# Patient Record
Sex: Female | Born: 1950 | Race: White | Hispanic: No | Smoking: Never smoker
Health system: Southern US, Community
[De-identification: ages and names within clinical notes are randomized; demographics above are authoritative.]

## PROBLEM LIST (undated history)

## (undated) DIAGNOSIS — Z789 Other specified health status: Secondary | ICD-10-CM

## (undated) DIAGNOSIS — I719 Aortic aneurysm of unspecified site, without rupture: Secondary | ICD-10-CM

## (undated) DIAGNOSIS — I10 Essential (primary) hypertension: Secondary | ICD-10-CM

## (undated) DIAGNOSIS — I739 Peripheral vascular disease, unspecified: Secondary | ICD-10-CM

## (undated) HISTORY — DX: Peripheral vascular disease, unspecified: I73.9

## (undated) HISTORY — PX: TUBAL LIGATION: SHX77

## (undated) HISTORY — PX: VARICOSE VEIN SURGERY: SHX832

## (undated) HISTORY — DX: Essential (primary) hypertension: I10

---

## 1998-07-29 ENCOUNTER — Emergency Department (HOSPITAL_COMMUNITY): Admission: EM | Admit: 1998-07-29 | Discharge: 1998-07-29 | Payer: Self-pay | Admitting: Emergency Medicine

## 1999-02-27 ENCOUNTER — Other Ambulatory Visit: Admission: RE | Admit: 1999-02-27 | Discharge: 1999-02-27 | Payer: Self-pay | Admitting: *Deleted

## 1999-02-27 ENCOUNTER — Encounter (INDEPENDENT_AMBULATORY_CARE_PROVIDER_SITE_OTHER): Payer: Self-pay

## 2000-10-12 ENCOUNTER — Encounter: Payer: Self-pay | Admitting: Cardiovascular Disease

## 2000-11-01 ENCOUNTER — Other Ambulatory Visit: Admission: RE | Admit: 2000-11-01 | Discharge: 2000-11-01 | Payer: Self-pay | Admitting: *Deleted

## 2001-06-30 ENCOUNTER — Ambulatory Visit (HOSPITAL_COMMUNITY): Admission: RE | Admit: 2001-06-30 | Discharge: 2001-06-30 | Payer: Self-pay | Admitting: Gastroenterology

## 2004-10-01 ENCOUNTER — Ambulatory Visit: Payer: Self-pay | Admitting: Family Medicine

## 2006-01-07 ENCOUNTER — Ambulatory Visit: Payer: Self-pay | Admitting: Family Medicine

## 2006-02-11 ENCOUNTER — Ambulatory Visit: Payer: Self-pay | Admitting: Family Medicine

## 2009-03-10 ENCOUNTER — Ambulatory Visit (HOSPITAL_COMMUNITY): Admission: RE | Admit: 2009-03-10 | Discharge: 2009-03-10 | Payer: Self-pay | Admitting: Internal Medicine

## 2010-04-29 ENCOUNTER — Ambulatory Visit (HOSPITAL_COMMUNITY): Admission: RE | Admit: 2010-04-29 | Discharge: 2010-04-29 | Payer: Self-pay | Admitting: Internal Medicine

## 2010-08-31 ENCOUNTER — Encounter: Payer: Self-pay | Admitting: Unknown Physician Specialty

## 2010-12-25 HISTORY — PX: OTHER SURGICAL HISTORY: SHX169

## 2010-12-25 NOTE — Procedures (Signed)
[facility]  Patient:    Amanda Vang, Amanda Vang Visit Number: [id number] MRN: [id number]          Service Type: END Location: ENDO Attending Physician:  Louie Bun Dictated by:   Everardo All Madilyn Fireman, M.D. Admit Date:  06/30/2001   CC:         Colon Flattery, D.O.   Procedure Report  PROCEDURE:  Colonoscopy.  ENDOSCOPIST:  Everardo All. Madilyn Fireman, M.D.  INDICATIONS:  Screening colonoscopy.  DESCRIPTION OF PROCEDURE:  The patient was placed in the left lateral decubitus position and placed on the pulse monitor with continuous low-flow oxygen delivered via nasal cannula.  She was sedated with 50 mg of IV Demerol and 5 mg IV Versed.  The Olympus videoscopic colonoscope was inserted into the rectum and advanced to the cecum, confirmed by transillumination of McBurneys point and visualization of the ileocecal vale and appendiceal orifice.  The prep was excellent.  The cecum, ascending, transverse, descending, and sigmoid colon all appeared normal with no masses, polyps, diverticula, or other mucosal abnormalities.  The rectum likewise appeared normal on retroflex view. The anus revealed no obvious internal hemorrhoids.  The  colonoscope was then withdrawn, and the patient returned to the recovery room in stable condition. She tolerated the procedure well and there were no immediate complications.  IMPRESSION:  Essentially normal colonoscopy.  PLAN:  Flexible sigmoidoscopy in five years.  Consider repeat colonoscopy in 10 years. Dictated by:   Everardo All Madilyn Fireman, M.D. Attending Physician:  Louie Bun DD:  06/30/01 TD:  07/01/01 Job: [id number]

## 2011-03-29 ENCOUNTER — Encounter (HOSPITAL_COMMUNITY): Payer: Self-pay

## 2011-03-29 ENCOUNTER — Encounter (HOSPITAL_COMMUNITY)
Admission: RE | Admit: 2011-03-29 | Discharge: 2011-03-29 | Disposition: A | Discharge status: Home / Self Care | Payer: BC Managed Care – PPO | Source: Ambulatory Visit | Attending: Ophthalmology | Admitting: Ophthalmology

## 2011-03-29 ENCOUNTER — Other Ambulatory Visit: Payer: Self-pay

## 2011-03-29 HISTORY — DX: Other specified health status: Z78.9

## 2011-03-29 LAB — BASIC METABOLIC PANEL
CO2: 28 mEq/L (ref 19–32)
Calcium: 9.4 mg/dL (ref 8.4–10.5)
Chloride: 104 mEq/L (ref 96–112)
Glucose, Bld: 107 mg/dL — ABNORMAL HIGH (ref 70–99)
Sodium: 140 mEq/L (ref 135–145)

## 2011-03-29 LAB — CBC
Hemoglobin: 13.7 g/dL (ref 12.0–15.0)
MCH: 28.5 pg (ref 26.0–34.0)
MCV: 85.9 fL (ref 78.0–100.0)
Platelets: 213 10*3/uL (ref 150–400)
RBC: 4.81 MIL/uL (ref 3.87–5.11)
WBC: 5.5 10*3/uL (ref 4.0–10.5)

## 2011-03-29 NOTE — Patient Instructions (Addendum)
20 Amanda Vang  03/29/2011   Your procedure is scheduled on:  Monday, 04/05/11  Report to [facility] at 08:25 AM.  Call this number if you have problems the morning of surgery: [phone]   Remember:   Do not eat food:After Midnight.  Do not drink clear liquids: After Midnight.  Take these medicines the morning of surgery with A SIP OF WATER: none   Do not wear jewelry, make-up or nail polish.  Do not wear lotions, powders, or perfumes. You may wear deodorant.  Do not shave 48 hours prior to surgery.  Do not bring valuables to the hospital.  Contacts, dentures or bridgework may not be worn into surgery.  Leave suitcase in the car. After surgery it may be brought to your room.  For patients admitted to the hospital, checkout time is 11:00 AM the day of discharge.   Patients discharged the day of surgery will not be allowed to drive home.  Name and phone number of your driver: family  Special Instructions: Use eye drops as prescribed.   Please read over the following fact sheets that you were given: Anesthesia Post-op Instructions   Sedation, Moderate, Adult Moderate sedation is given to help you relax or even sleep through a procedure. You may remain sleepy, be clumsy, or have poor balance for several hours following this procedure. Arrange for a responsible adult, family member, or friend to take you home. A responsible adult should stay with you for at least 24 hours or until the medicines have worn off.  Do not participate in any activities where you could become injured for the next 24 hours, or until you feel normal again. Do not:   Drive.  Swim.   Ride a bicycle.   Operate heavy machinery.   Cook.  Use power tools.   Climb ladders.   Work at [employer].    Do not make important decisions or sign legal documents until you are improved.   Vomiting may occur if you eat too soon. When you can drink without vomiting, try water, juice, or soup. Try solid foods if you feel  little or no nausea.   Only take over-the-counter or prescription medications for pain, discomfort, or fever as directed by your caregiver. If pain medications have been prescribed for you, ask your caregiver how soon it is safe to take them.   Make sure you and your family fully understands everything about the medication given to you. Make sure you understand what side effects may occur.   You should not drink alcohol, take sleeping pills, or medications that cause drowsiness for at least 24 hours.   If you smoke, do not smoke alone.   If you are feeling better, you may resume normal activities 24 hours after receiving sedation.   Keep all appointments as scheduled. Follow all instructions.   Ask questions if you do not understand.  SEEK MEDICAL CARE IF:  Your skin is pale or bluish in color.   You continue to feel sick to your stomach (nauseated) or throw up (vomit).   Your pain is getting worse and not helped by medication.   There is bleeding or swelling.   You are still sleepy or feeling clumsy after 24 hours.   You have an oral temperature above 102 F (38.9 C).  SEEK IMMEDIATE MEDICAL CARE IF:   You develop a rash.   You have difficulty breathing.   You develop any type of allergic problem.   You have  an oral temperature above 102 F (38.9 C), not controlled by medicine.  Document Released: 04/20/2001 Document Re-Released: 01/13/2010 [facility] Patient Information 2011 [place], [place].

## 2011-04-05 ENCOUNTER — Ambulatory Visit (HOSPITAL_COMMUNITY): Payer: BC Managed Care – PPO | Admitting: Anesthesiology

## 2011-04-05 ENCOUNTER — Encounter (HOSPITAL_COMMUNITY)
Admission: RE | Disposition: A | Discharge status: Home / Self Care | Payer: Self-pay | Source: Ambulatory Visit | Attending: Ophthalmology

## 2011-04-05 ENCOUNTER — Encounter (HOSPITAL_COMMUNITY): Payer: Self-pay | Admitting: Anesthesiology

## 2011-04-05 ENCOUNTER — Ambulatory Visit (HOSPITAL_COMMUNITY)
Admission: RE | Admit: 2011-04-05 | Discharge: 2011-04-05 | Disposition: A | Discharge status: Home / Self Care | Payer: BC Managed Care – PPO | Source: Ambulatory Visit | Attending: Ophthalmology | Admitting: Ophthalmology

## 2011-04-05 ENCOUNTER — Encounter (HOSPITAL_COMMUNITY): Payer: Self-pay | Admitting: *Deleted

## 2011-04-05 DIAGNOSIS — Z01812 Encounter for preprocedural laboratory examination: Secondary | ICD-10-CM | POA: Insufficient documentation

## 2011-04-05 DIAGNOSIS — H268 Other specified cataract: Secondary | ICD-10-CM | POA: Insufficient documentation

## 2011-04-05 DIAGNOSIS — H2510 Age-related nuclear cataract, unspecified eye: Secondary | ICD-10-CM | POA: Insufficient documentation

## 2011-04-05 HISTORY — PX: CATARACT EXTRACTION W/PHACO: SHX586

## 2011-04-05 SURGERY — PHACOEMULSIFICATION, CATARACT, WITH IOL INSERTION
Anesthesia: Monitor Anesthesia Care | Site: Eye | Laterality: Left | Wound class: Clean

## 2011-04-05 MED ORDER — NEOMYCIN-POLYMYXIN-DEXAMETH 0.1 % OP OINT
TOPICAL_OINTMENT | OPHTHALMIC | Status: DC | PRN
  Administered 2011-04-05: 1 via OPHTHALMIC

## 2011-04-05 MED ORDER — PHENYLEPHRINE HCL 2.5 % OP SOLN
1.0000 [drp] | OPHTHALMIC | Status: AC
  Administered 2011-04-05 (×3): 1 [drp] via OPHTHALMIC

## 2011-04-05 MED ORDER — TETRACAINE HCL 0.5 % OP SOLN
1.0000 [drp] | OPHTHALMIC | Status: AC
  Administered 2011-04-05 (×3): 1 [drp] via OPHTHALMIC

## 2011-04-05 MED ORDER — MIDAZOLAM HCL 2 MG/2ML IJ SOLN
INTRAMUSCULAR | Status: AC
  Administered 2011-04-05: 2 mg via INTRAVENOUS
  Filled 2011-04-05: qty 2

## 2011-04-05 MED ORDER — LACTATED RINGERS IV SOLN
INTRAVENOUS | Status: DC | PRN
  Administered 2011-04-05: 09:00:00 via INTRAVENOUS

## 2011-04-05 MED ORDER — EPINEPHRINE HCL 1 MG/ML IJ SOLN
INTRAOCULAR | Status: DC | PRN
  Administered 2011-04-05: 10:00:00

## 2011-04-05 MED ORDER — LIDOCAINE HCL 3.5 % OP GEL
OPHTHALMIC | Status: AC
  Administered 2011-04-05: 1 via OPHTHALMIC
  Filled 2011-04-05: qty 5

## 2011-04-05 MED ORDER — NEOMYCIN-POLYMYXIN-DEXAMETH 3.5-10000-0.1 OP OINT
TOPICAL_OINTMENT | OPHTHALMIC | Status: AC
  Filled 2011-04-05: qty 3.5

## 2011-04-05 MED ORDER — MIDAZOLAM HCL 2 MG/2ML IJ SOLN
1.0000 mg | INTRAMUSCULAR | Status: DC | PRN
  Administered 2011-04-05: 2 mg via INTRAVENOUS

## 2011-04-05 MED ORDER — CYCLOPENTOLATE-PHENYLEPHRINE 0.2-1 % OP SOLN
1.0000 [drp] | OPHTHALMIC | Status: AC
Start: 2011-04-05 — End: 2011-04-05
  Administered 2011-04-05 (×3): 1 [drp] via OPHTHALMIC

## 2011-04-05 MED ORDER — PHENYLEPHRINE HCL 2.5 % OP SOLN
OPHTHALMIC | Status: AC
  Administered 2011-04-05: 1 [drp] via OPHTHALMIC
  Filled 2011-04-05: qty 2

## 2011-04-05 MED ORDER — LACTATED RINGERS IV SOLN: INTRAVENOUS | Status: DC

## 2011-04-05 MED ORDER — PROVISC 10 MG/ML IO SOLN
INTRAOCULAR | Status: DC | PRN
  Administered 2011-04-05: 8.5 mg via OPHTHALMIC

## 2011-04-05 MED ORDER — POVIDONE-IODINE 5 % OP SOLN
OPHTHALMIC | Status: DC | PRN
  Administered 2011-04-05: 1 via OPHTHALMIC

## 2011-04-05 MED ORDER — CYCLOPENTOLATE-PHENYLEPHRINE 0.2-1 % OP SOLN
OPHTHALMIC | Status: AC
  Administered 2011-04-05: 1 [drp] via OPHTHALMIC
  Filled 2011-04-05: qty 2

## 2011-04-05 MED ORDER — LIDOCAINE HCL 3.5 % OP GEL
1.0000 "application " | Freq: Once | OPHTHALMIC | Status: AC
  Administered 2011-04-05: 1 via OPHTHALMIC

## 2011-04-05 MED ORDER — TETRACAINE HCL 0.5 % OP SOLN
OPHTHALMIC | Status: AC
  Administered 2011-04-05: 1 [drp] via OPHTHALMIC
  Filled 2011-04-05: qty 2

## 2011-04-05 MED ORDER — KETOROLAC TROMETHAMINE 0.5 % OP SOLN: 1.0000 [drp] | OPHTHALMIC | Status: AC

## 2011-04-05 MED ORDER — LIDOCAINE HCL 3.5 % OP GEL
OPHTHALMIC | Status: DC | PRN
  Administered 2011-04-05: 1 via OPHTHALMIC

## 2011-04-05 MED ORDER — LIDOCAINE HCL (PF) 1 % IJ SOLN
INTRAMUSCULAR | Status: AC
  Filled 2011-04-05: qty 2

## 2011-04-05 MED ORDER — LACTATED RINGERS IV SOLN
INTRAVENOUS | Status: DC
  Administered 2011-04-05: 1000 mL via INTRAVENOUS

## 2011-04-05 MED ORDER — EPINEPHRINE HCL 1 MG/ML IJ SOLN
INTRAMUSCULAR | Status: AC
  Filled 2011-04-05: qty 1

## 2011-04-05 MED ORDER — LIDOCAINE HCL (PF) 1 % IJ SOLN
INTRAOCULAR | Status: DC | PRN
  Administered 2011-04-05: 10:00:00 via OPHTHALMIC

## 2011-04-05 MED ORDER — BSS IO SOLN
INTRAOCULAR | Status: DC | PRN
  Administered 2011-04-05: 15 mL via OPHTHALMIC

## 2011-04-05 SURGICAL SUPPLY — 34 items
CAPSULAR TENSION RING-AMO (OPHTHALMIC RELATED) IMPLANT
CLOTH BEACON ORANGE TIMEOUT ST (SAFETY) ×2 IMPLANT
DUOVISC SYSTEM (INTRAOCULAR LENS)
EYE SHIELD UNIVERSAL CLEAR (GAUZE/BANDAGES/DRESSINGS) ×2 IMPLANT
GLOVE BIO SURGEON STRL SZ 6.5 (GLOVE) IMPLANT
GLOVE BIOGEL PI IND STRL 6.5 (GLOVE) ×1 IMPLANT
GLOVE BIOGEL PI IND STRL 7.0 (GLOVE) IMPLANT
GLOVE BIOGEL PI IND STRL 7.5 (GLOVE) IMPLANT
GLOVE BIOGEL PI INDICATOR 6.5 (GLOVE) ×1
GLOVE BIOGEL PI INDICATOR 7.0 (GLOVE)
GLOVE BIOGEL PI INDICATOR 7.5 (GLOVE)
GLOVE ECLIPSE 6.5 STRL STRAW (GLOVE) IMPLANT
GLOVE ECLIPSE 7.0 STRL STRAW (GLOVE) IMPLANT
GLOVE ECLIPSE 7.5 STRL STRAW (GLOVE) IMPLANT
GLOVE EXAM NITRILE LRG STRL (GLOVE) IMPLANT
GLOVE EXAM NITRILE MD LF STRL (GLOVE) ×2 IMPLANT
GLOVE SKINSENSE NS SZ6.5 (GLOVE)
GLOVE SKINSENSE NS SZ7.0 (GLOVE)
GLOVE SKINSENSE STRL SZ6.5 (GLOVE) IMPLANT
GLOVE SKINSENSE STRL SZ7.0 (GLOVE) IMPLANT
KIT VITRECTOMY (OPHTHALMIC RELATED) IMPLANT
PAD ARMBOARD 7.5X6 YLW CONV (MISCELLANEOUS) ×2 IMPLANT
PROC W NO LENS (INTRAOCULAR LENS)
PROC W SPEC LENS (INTRAOCULAR LENS)
PROCESS W NO LENS (INTRAOCULAR LENS) IMPLANT
PROCESS W SPEC LENS (INTRAOCULAR LENS) IMPLANT
RING MALYGIN (MISCELLANEOUS) IMPLANT
SIGHTPATH CAT PROC W REG LENS (Ophthalmic Related) ×2 IMPLANT
SYR TB 1ML LL NO SAFETY (SYRINGE) ×2 IMPLANT
SYSTEM DUOVISC (INTRAOCULAR LENS) IMPLANT
TAPE SURG TRANSPORE 1 IN (GAUZE/BANDAGES/DRESSINGS) ×1 IMPLANT
TAPE SURGICAL TRANSPORE 1 IN (GAUZE/BANDAGES/DRESSINGS) ×1
VISCOELASTIC ADDITIONAL (OPHTHALMIC RELATED) IMPLANT
WATER STERILE IRR 250ML POUR (IV SOLUTION) ×2 IMPLANT

## 2011-04-05 NOTE — Anesthesia Postprocedure Evaluation (Signed)
Anesthesia Post Note  Patient: Amanda Vang  Procedure(s) Performed:  CATARACT EXTRACTION PHACO AND INTRAOCULAR LENS PLACEMENT (IOC) - CDE 11.84  Anesthesia type: MAC  Patient location: Short Stay  Post pain: Pain level controlled  Post assessment: Post-op Vital signs reviewed, Patient's Cardiovascular Status Stable, Respiratory Function Stable and No signs of Nausea or vomiting  Last Vitals:  Filed Vitals:   04/05/11 1025  BP: 128/83  Pulse: 57  Temp: 97.8 F (36.6 C)  Resp: 18    Post vital signs: Reviewed and stable  Level of consciousness: awake, alert , oriented and patient cooperative  Complications: No apparent anesthesia complications

## 2011-04-05 NOTE — Transfer of Care (Signed)
  Anesthesia Post-op Note  Patient: Amanda Vang  Procedure(s) Performed:  CATARACT EXTRACTION PHACO AND INTRAOCULAR LENS PLACEMENT (IOC) - CDE 11.84  Patient Location: PACU and Short Stay  Anesthesia Type: MAC  Level of Consciousness: awake, alert , oriented and patient cooperative  Airway and Oxygen Therapy: Patient Spontanous Breathing  Post-op Pain: none  Post-op Assessment: Post-op Vital signs reviewed, Patient's Cardiovascular Status Stable, Respiratory Function Stable and No signs of Nausea or vomiting  Post-op Vital Signs: Reviewed and stable  Complications: No apparent anesthesia complications

## 2011-04-05 NOTE — Op Note (Signed)
Amanda, Vang               ACCOUNT NO.:  [id number]  MEDICAL RECORD NO.:  [id number]  LOCATION:  [facility]                          FACILITY:  [facility]  PHYSICIAN:  Susanne Greenhouse, MD       DATE OF BIRTH:  1950/12/30  DATE OF PROCEDURE:  04/05/2011 DATE OF DISCHARGE:  04/05/2011                              OPERATIVE REPORT   PREOPERATIVE DIAGNOSIS:  Nuclear cataract, left eye; diagnosis code 366.16.  POSTOPERATIVE DIAGNOSES: 1. Nuclear cataract, left eye; diagnosis code 366.16. 2. 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  OPERATION PERFORMED:  Phacoemulsification with posterior chamber intraocular lens implantation, left eye.  SURGEON:  Susanne Greenhouse, MD.  ANESTHESIA:  General endotracheal anesthesia.  OPERATIVE SUMMARY:  In the preoperative area, dilating drops were placed into the left eye.  The patient was then brought into the operating room where she was placed under general anesthesia.  The eye was then prepped and draped.  Beginning with a 75 blade, a paracentesis port was made at the surgeon's 2 o'clock position.  The anterior chamber was then filled with a 1% nonpreserved lidocaine solution with epinephrine.  This was followed by Viscoat to deepen the chamber.  A small fornix-based peritomy was performed superiorly.  Next, a single iris hook was placed through the limbus superiorly.  A 2.4-mm keratome blade was then used to make a clear corneal incision over the iris hook.  A bent cystotome needle and Utrata forceps were used to create a continuous tear capsulotomy.  Hydrodissection was performed using balanced salt solution on a fine cannula.  The lens nucleus was then removed using phacoemulsification in a quadrant cracking technique.  The cortical material was then removed with irrigation and aspiration.  The capsular bag and anterior chamber were refilled with Provisc.  The wound was widened to approximately 3 mm and a posterior chamber  intraocular lens was placed into the capsular bag without difficulty using an [employer] lens injecting system.  A single 10-0 nylon suture was then used to close the incision as well as stromal hydration.  The Provisc was removed from the anterior chamber and capsular bag with irrigation and aspiration.  At this point, the wounds were tested for leak, which were negative.  The anterior chamber remained deep and stable.  The patient tolerated the procedure well.  There were no operative complications, and she awoke from general anesthesia without problem.  No surgical specimens.  Prosthetic device used is a Lenstec posterior chamber lens, model Softec HD, power of 19.5, serial number is [id number].          ______________________________ Susanne Greenhouse, MD     KEH/MEDQ  D:  04/05/2011  T:  04/05/2011  Job:  [id number]

## 2011-04-05 NOTE — Brief Op Note (Signed)
04/05/2011  10:24 AM  PATIENT:  Amanda Vang  60 y.o. female  PRE-OPERATIVE DIAGNOSIS:  nuclear cataract left eye  POST-OPERATIVE DIAGNOSIS:  nuclear cataract left eye  PROCEDURE:  Procedure(s): CATARACT EXTRACTION PHACO AND INTRAOCULAR LENS PLACEMENT (IOC)  SURGEON:  Surgeon(s): Gemma Payor   ANESTHESIA:   local and IV sedation   SPECIMEN:  No Specimen   Complications: NONE

## 2011-04-05 NOTE — H&P (Signed)
I have evaluated the patient preoperatively, and have identified no interval changes in medical condition and plan of care since the history and physical of record 

## 2011-04-05 NOTE — Anesthesia Preprocedure Evaluation (Addendum)
Anesthesia Evaluation  Name, MR# and DOB Patient awake  General Assessment Comment  Reviewed: Allergy & Precautions, H&P , NPO status , Patient's Chart, lab work & pertinent test results  History of Anesthesia Complications Negative for: history of anesthetic complications  Airway Mallampati: II  Neck ROM: Full    Dental  (+) Teeth Intact   Pulmonary    pulmonary exam normalPulmonary Exam Normal     Cardiovascular Regular Normal    Neuro/Psych   GI/Hepatic/Renal   Endo/Other    Abdominal   Musculoskeletal   Hematology   Peds  Reproductive/Obstetrics    Anesthesia Other Findings            Anesthesia Physical Anesthesia Plan  ASA: I  Anesthesia Plan: MAC   Post-op Pain Management:    Induction: Intravenous  Airway Management Planned: Nasal Cannula  Additional Equipment:   Intra-op Plan:   Post-operative Plan:   Informed Consent: I have reviewed the patients History and Physical, chart, labs and discussed the procedure including the risks, benefits and alternatives for the proposed anesthesia with the patient or authorized representative who has indicated his/her understanding and acceptance.     Plan Discussed with:   Anesthesia Plan Comments:         Anesthesia Quick Evaluation

## 2011-04-09 ENCOUNTER — Encounter (HOSPITAL_COMMUNITY): Payer: Self-pay | Admitting: Ophthalmology

## 2011-09-28 ENCOUNTER — Encounter: Payer: Self-pay | Admitting: Cardiovascular Disease

## 2011-09-28 ENCOUNTER — Ambulatory Visit (INDEPENDENT_AMBULATORY_CARE_PROVIDER_SITE_OTHER): Payer: BC Managed Care – PPO | Admitting: Cardiovascular Disease

## 2011-09-28 VITALS — BP 139/84 | HR 71 | Ht 64.0 in | Wt 141.8 lb

## 2011-09-28 DIAGNOSIS — R079 Chest pain, unspecified: Secondary | ICD-10-CM | POA: Insufficient documentation

## 2011-09-28 DIAGNOSIS — Z136 Encounter for screening for cardiovascular disorders: Secondary | ICD-10-CM

## 2011-09-28 NOTE — Progress Notes (Signed)
Amanda Vang Date of Birth  06/19/1951 [facility]     [place] Office  [address]    [address]   [address] [place], [place]  [address]    [place], [place]  [address] [phone]  Fax  [phone]  [phone]  Fax [phone]  1. Chest pain  History of Present Illness:  Amanda Vang is a 61 yo with new onset of chest pain.  She awoke with dull chest pain on 2 occasions.  These were described as a pressure like sensation and lasted 5 minutes.   1 additional pain occurred when she was walking with her grandson.  That pain resolved after 10 minutes. She drank some soda, walked around  And the pain eventually resolved.  She did lots of yard work yesterday and did not have any recurrence of her chest pain.    She is very active and works out regularly - she helps her son out at his nursery 3 days a week.  Current Outpatient Prescriptions on File Prior to Visit  Medication Sig Dispense Refill  . Calcium Carbonate-Vitamin D (CALCIUM 600+D) 600-400 MG-UNIT per tablet Take 1 tablet by mouth 2 (two) times daily.        Marland Kitchen estradiol (VAGIFEM) 25 MCG vaginal tablet Place 25 mcg vaginally 2 (two) times a week.        Marland Kitchen ibuprofen (ADVIL,MOTRIN) 200 MG tablet Take 200 mg by mouth every 6 (six) hours as needed. Pain         No Known Allergies  Past Medical History  Diagnosis Date  . No pertinent past medical history     Past Surgical History  Procedure Date  . Colonoscopy 12/25/10    normal  . Tubal ligation   . Cataract extraction w/phaco 04/05/2011    Procedure: CATARACT EXTRACTION PHACO AND INTRAOCULAR LENS PLACEMENT (IOC);  Surgeon: Gemma Payor;  Location: [facility];  Service: Ophthalmology;  Laterality: Left;  CDE 11.84    History  Smoking status  . Former Smoker -- 0.2 packs/day for 2 years  . Types: Cigarettes  . Quit date: 03/28/1961  Smokeless tobacco  . Not on file    History  Alcohol Use  . 4.2 oz/week  . 7 Glasses of wine per week    Family  History  Problem Relation Age of Onset  . Anesthesia problems Neg Hx   . Hypotension Neg Hx   . Malignant hyperthermia Neg Hx   . Pseudochol deficiency Neg Hx     Reviw of Systems:  Reviewed in the HPI.  All other systems are negative.  Physical Exam: Blood pressure 139/84, pulse 71, height 5\' 4"  (1.626 m), weight 141 lb 12.8 oz (64.32 kg). General: Well developed, well nourished, in no acute distress.  Head: Normocephalic, atraumatic, sclera non-icteric, mucus membranes are moist,   Neck: Supple. Negative for carotid bruits. JVD not elevated.  Lungs: Clear bilaterally to auscultation without wheezes, rales, or rhonchi. Breathing is unlabored.  Heart: RRR with S1 S2. No murmurs, rubs, or gallops appreciated.  Abdomen: Soft, non-tender, non-distended with normoactive bowel sounds. No hepatomegaly. No rebound/guarding. She has a pulsitile midline mass c/w abdominal aorta.  Msk:  Strength and tone appear normal for age.  Extremities: No clubbing or cyanosis. No edema.  Distal pedal pulses are 2+ and equal bilaterally.  Neuro: Alert and oriented X 3. Moves all extremities spontaneously.  Psych:  Responds to questions appropriately with a normal affect.  ECG: NSR. No ST or  T wave changes  Assessment / Plan:

## 2011-09-28 NOTE — Assessment & Plan Note (Signed)
Amanda Vang presents with chest pain / pressure.  The episode lasted 10 minutes and was eventually relieved with walking around.  I would like to schedule a stress echo.  She was also found to have a pulsitile abdominal mass.  This may be just her normal abdominal aorta but I cannot rule out AAA.  Will get an abdominal ultrasound.

## 2011-09-28 NOTE — Patient Instructions (Signed)
Your physician recommends that you schedule a follow-up appointment in: 3 months Your physician has requested that you have an abdominal ultrasound. During this test, an ultrasound is used to evaluate the aorta. Allow 30 minutes for this exam. Do not eat after midnight the day before and avoid carbonated beverages

## 2011-10-13 ENCOUNTER — Encounter (INDEPENDENT_AMBULATORY_CARE_PROVIDER_SITE_OTHER): Payer: BC Managed Care – PPO

## 2011-10-13 DIAGNOSIS — R109 Unspecified abdominal pain: Secondary | ICD-10-CM

## 2011-10-13 DIAGNOSIS — I700 Atherosclerosis of aorta: Secondary | ICD-10-CM

## 2011-10-13 DIAGNOSIS — Z136 Encounter for screening for cardiovascular disorders: Secondary | ICD-10-CM

## 2011-12-12 ENCOUNTER — Emergency Department (HOSPITAL_COMMUNITY)
Admission: EM | Admit: 2011-12-12 | Discharge: 2011-12-12 | Disposition: A | Discharge status: Home / Self Care | Payer: BC Managed Care – PPO | Attending: Emergency Medicine | Admitting: Emergency Medicine

## 2011-12-12 ENCOUNTER — Emergency Department (HOSPITAL_COMMUNITY): Payer: BC Managed Care – PPO

## 2011-12-12 ENCOUNTER — Encounter (HOSPITAL_COMMUNITY): Payer: Self-pay

## 2011-12-12 DIAGNOSIS — S61209A Unspecified open wound of unspecified finger without damage to nail, initial encounter: Secondary | ICD-10-CM | POA: Insufficient documentation

## 2011-12-12 DIAGNOSIS — IMO0001 Reserved for inherently not codable concepts without codable children: Secondary | ICD-10-CM | POA: Insufficient documentation

## 2011-12-12 DIAGNOSIS — S61259A Open bite of unspecified finger without damage to nail, initial encounter: Secondary | ICD-10-CM

## 2011-12-12 DIAGNOSIS — Z23 Encounter for immunization: Secondary | ICD-10-CM | POA: Insufficient documentation

## 2011-12-12 MED ORDER — AMOXICILLIN-POT CLAVULANATE 875-125 MG PO TABS
1.0000 | ORAL_TABLET | Freq: Once | ORAL | Status: AC
  Administered 2011-12-12: 1 via ORAL
  Filled 2011-12-12: qty 1

## 2011-12-12 MED ORDER — TETANUS-DIPHTH-ACELL PERTUSSIS 5-2.5-18.5 LF-MCG/0.5 IM SUSP
0.5000 mL | Freq: Once | INTRAMUSCULAR | Status: AC
  Administered 2011-12-12: 0.5 mL via INTRAMUSCULAR
  Filled 2011-12-12: qty 0.5

## 2011-12-12 MED ORDER — LIDOCAINE HCL (PF) 1 % IJ SOLN
INTRAMUSCULAR | Status: AC
  Administered 2011-12-12: 2.1 mL
  Filled 2011-12-12: qty 5

## 2011-12-12 MED ORDER — AMOXICILLIN-POT CLAVULANATE 875-125 MG PO TABS: 1.0000 | ORAL_TABLET | Freq: Two times a day (BID) | ORAL | Status: AC

## 2011-12-12 MED ORDER — CEFTRIAXONE SODIUM 1 G IJ SOLR
1.0000 g | Freq: Once | INTRAMUSCULAR | Status: AC
Start: 2011-12-12 — End: 2011-12-12
  Administered 2011-12-12: 1 g via INTRAMUSCULAR
  Filled 2011-12-12: qty 10

## 2011-12-12 NOTE — ED Notes (Signed)
[facility] at bedside,

## 2011-12-12 NOTE — ED Notes (Signed)
Finger splint applied to left little finger, pt tolerated well.

## 2011-12-12 NOTE — ED Notes (Signed)
[facility] contacted ref. Animal bite, advised that they would send [facility] to er to speak with pt

## 2011-12-12 NOTE — Discharge Instructions (Signed)
Animal Bite An animal bite can result in a scratch on the skin, deep open cut, puncture of the skin, crush injury, or tearing away of the skin or a body part. Dogs are responsible for most animal bites. Children are bitten more often than adults. An animal bite can range from very mild to more serious. A small bite from your house pet is no cause for alarm. However, some animal bites can become infected or injure a bone or other tissue. You must seek medical care if:  The skin is broken and bleeding does not slow down or stop after 15 minutes.   The puncture is deep and difficult to clean (such as a cat bite).   Pain, warmth, redness, or pus develops around the wound.   The bite is from a stray animal or rodent. There may be a risk of rabies infection.   The bite is from a snake, raccoon, skunk, fox, coyote, or bat. There may be a risk of rabies infection.   The person bitten has a chronic illness such as diabetes, liver disease, or cancer, or the person takes medicine that lowers the immune system.   There is concern about the location and severity of the bite.  It is important to clean and protect an animal bite wound right away to prevent infection. Follow these steps:  Clean the wound with plenty of water and soap.   Apply an antibiotic cream.   Apply gentle pressure over the wound with a clean towel or gauze to slow or stop bleeding.   Elevate the affected area above the heart to help stop any bleeding.   Seek medical care. Getting medical care within 8 hours of the animal bite leads to the best possible outcome.  DIAGNOSIS  Your caregiver will most likely:  Take a detailed history of the animal and the bite injury.   Perform a wound exam.   Take your medical history.  Blood tests or X-rays may be performed. Sometimes, infected bite wounds are cultured and sent to a lab to identify the infectious bacteria.  TREATMENT  Medical treatment will depend on the location and type of  animal bite as well as the patient's medical history. Treatment may include:  Wound care, such as cleaning and flushing the wound with saline solution, bandaging, and elevating the affected area.   Antibiotics.   Tetanus immunization.   Rabies immunization.   Leaving the wound open to heal. This is often done with animal bites, due to the high risk of infection. However, in certain cases, wound closure with stitches, wound adhesive, skin adhesive strips, or staples may be used.  Infected bites that are left untreated may require intravenous (IV) antibiotics and surgical treatment in the hospital. HOME CARE INSTRUCTIONS  Follow your caregiver's instructions for wound care.   Take all medicines as directed.   If your caregiver prescribes antibiotics, take them as directed. Finish them even if you start to feel better.   Follow up with your caregiver for further exams or immunizations as directed.  You may need a tetanus shot if:  You cannot remember when you had your last tetanus shot.   You have never had a tetanus shot.   The injury broke your skin.  If you get a tetanus shot, your arm may swell, get red, and feel warm to the touch. This is common and not a problem. If you need a tetanus shot and you choose not to have one, there is a   rare chance of getting tetanus. Sickness from tetanus can be serious. SEEK MEDICAL CARE IF:  You notice warmth, redness, soreness, swelling, pus discharge, or a bad smell coming from the wound.   You have a red line on the skin coming from the wound.   You have a fever, chills, or a general ill feeling.   You have nausea or vomiting.   You have continued or worsening pain.   You have trouble moving the injured part.   You have other questions or concerns.  MAKE SURE YOU:  Understand these instructions.   Will watch your condition.   Will get help right away if you are not doing well or get worse.  Document Released: 04/13/2011  Document Revised: 07/15/2011 Document Reviewed: 04/13/2011 [facility] Patient Information 2012 [place], [place].   Take the antibiotic as directed.  If not improving in the next couple days or if the symptoms worsen or change return to the ED right away.

## 2011-12-12 NOTE — ED Provider Notes (Signed)
History     CSN: [id number]  Arrival date & time 12/12/11  1213   First MD Initiated Contact with Patient 12/12/11 1319      Chief Complaint  Patient presents with  . Animal Bite    cat    (Consider location/radiation/quality/duration/timing/severity/associated sxs/prior treatment) Patient is a 61 y.o. female presenting with animal bite. The history is provided by the patient. No language interpreter was used.  Animal Bite  The incident occurred yesterday. Incident location: pt took her house cat to a local fire dept to get it's annual rabies vaccination.  a dog there started barking, scared the cat which pt the pt on L 5th finger. She came to the ER via personal transport. There is an injury to the left little finger. There have been no prior injuries to these areas. She is right-handed. Her tetanus status is out of date. There were no sick contacts. She has received no recent medical care.    Past Medical History  Diagnosis Date  . No pertinent past medical history     Past Surgical History  Procedure Date  . Colonoscopy 12/25/10    normal  . Tubal ligation   . Cataract extraction w/phaco 04/05/2011    Procedure: CATARACT EXTRACTION PHACO AND INTRAOCULAR LENS PLACEMENT (IOC);  Surgeon: Gemma Payor;  Location: [facility];  Service: Ophthalmology;  Laterality: Left;  CDE 11.84    Family History  Problem Relation Age of Onset  . Anesthesia problems Neg Hx   . Hypotension Neg Hx   . Malignant hyperthermia Neg Hx   . Pseudochol deficiency Neg Hx     History  Substance Use Topics  . Smoking status: Former Smoker -- 0.2 packs/day for 2 years    Types: Cigarettes    Quit date: 03/28/1961  . Smokeless tobacco: Not on file  . Alcohol Use: 4.2 oz/week    7 Glasses of wine per week    OB History    Grav Para Term Preterm Abortions TAB SAB Ect Mult Living                  Review of Systems  Constitutional: Negative for fever.  Musculoskeletal:       Finger injury     Hematological: Negative for adenopathy.  All other systems reviewed and are negative.    Allergies  Review of patient's allergies indicates no known allergies.  Home Medications   Current Outpatient Rx  Name Route Sig Dispense Refill  . AMOXICILLIN-POT CLAVULANATE 875-125 MG PO TABS Oral Take 1 tablet by mouth every 12 (twelve) hours. 14 tablet 0  . ASPIRIN 81 MG PO TABS Oral Take 81 mg by mouth daily.    Marland Kitchen CALCIUM CARBONATE-VITAMIN D 600-400 MG-UNIT PO TABS Oral Take 1 tablet by mouth 2 (two) times daily.      Marland Kitchen ESTRADIOL 25 MCG VA TABS Vaginal Place 25 mcg vaginally 2 (two) times a week.      . IBUPROFEN 200 MG PO TABS Oral Take 200 mg by mouth every 6 (six) hours as needed. Pain       BP 142/75  Pulse 66  Temp(Src) 97.2 F (36.2 C) (Oral)  Resp 20  Ht 5\' 4"  (1.626 m)  Wt 140 lb (63.504 kg)  BMI 24.03 kg/m2  SpO2 100%  Physical Exam  Nursing note and vitals reviewed. Constitutional: She is oriented to person, place, and time. She appears well-developed and well-nourished. No distress.  HENT:  Head: Normocephalic and atraumatic.  Eyes:  EOM are normal.  Neck: Normal range of motion.  Cardiovascular: Normal rate, regular rhythm and normal heart sounds.   Pulmonary/Chest: Effort normal and breath sounds normal.  Abdominal: Soft. She exhibits no distension. There is no tenderness.  Musculoskeletal: She exhibits tenderness.       Left hand: She exhibits tenderness, bony tenderness, laceration and swelling. She exhibits normal range of motion, normal two-point discrimination, normal capillary refill and no deformity. normal sensation noted. Normal strength noted.       Hands: Neurological: She is alert and oriented to person, place, and time.  Skin: Skin is warm and dry.  Psychiatric: She has a normal mood and affect. Judgment normal.    ED Course  Procedures (including critical care time)  Labs Reviewed - No data to display Dg Finger Little Left  12/12/2011   *RADIOLOGY REPORT*  Clinical Data: Right.  LEFT LITTLE FINGER 2+V  Comparison: None.  Findings: No fracture or radiopaque foreign body.  IMPRESSION: No fracture or radiopaque foreign body.  Original Report Authenticated By: Fuller Canada, M.D.     1. Animal bite of finger       MDM  rx augmentin Rocephin 1 gm IM given Return if sxs worsen or not improving.        Worthy Rancher, PA 12/12/11 [phone]

## 2011-12-12 NOTE — ED Notes (Signed)
Pt took her cat to rabies clinic yesterday, cat scratched and bit the pt.  Left hand now swollen and red.

## 2011-12-13 NOTE — ED Provider Notes (Signed)
Medical screening examination/treatment/procedure(s) were performed by non-physician practitioner and as supervising physician I was immediately available for consultation/collaboration.  Loretto Belinsky, MD 12/13/11 2158 

## 2011-12-14 ENCOUNTER — Ambulatory Visit (INDEPENDENT_AMBULATORY_CARE_PROVIDER_SITE_OTHER): Payer: BC Managed Care – PPO | Admitting: Cardiovascular Disease

## 2011-12-14 ENCOUNTER — Encounter: Payer: Self-pay | Admitting: Cardiovascular Disease

## 2011-12-14 DIAGNOSIS — R079 Chest pain, unspecified: Secondary | ICD-10-CM

## 2011-12-14 NOTE — Progress Notes (Signed)
[name] Date of Birth  1951/06/13 [facility]     [place] [facility]  [address]    [address]   [address] [place], [place]  [address]    [place], [place]  [address] [phone]  Fax  [phone]  [phone]  Fax [phone]  1. Chest pain  History of Present Illness:  Amanda Vang is a 61 yo with new onset of chest pain.  She awoke with dull chest pain on 2 occasions.  These were described as a pressure like sensation and lasted 5 minutes.   1 additional pain occurred when she was walking with her grandson.  That pain resolved after 10 minutes. She drank some soda, walked around  And the pain eventually resolved.  She did lots of yard work yesterday and did not have any recurrence of her chest pain.    She is very active and works out regularly - she helps her son out at his nursery 3 days a week.  She's continued to have several episodes of chest pressure since I last saw her a month or so ago.  These pains are not related to exercise, eating, drinking, or change of position.  She walks for an hour a day and does not have any chest pain associated with walking.    The abdominal aortic duplex scan did not reveal any evidence of an abdominal aortic aneurysm.  Current Outpatient Prescriptions on File Prior to Visit  Medication Sig Dispense Refill  . amoxicillin-clavulanate (AUGMENTIN) 875-125 MG per tablet Take 1 tablet by mouth every 12 (twelve) hours.  14 tablet  0  . aspirin 81 MG tablet Take 81 mg by mouth daily.      . Calcium Carbonate-Vitamin D (CALCIUM 600+D) 600-400 MG-UNIT per tablet Take 1 tablet by mouth 2 (two) times daily.        Marland Kitchen estradiol (VAGIFEM) 25 MCG vaginal tablet Place 25 mcg vaginally 2 (two) times a week.        Marland Kitchen ibuprofen (ADVIL,MOTRIN) 200 MG tablet Take 200 mg by mouth every 6 (six) hours as needed. Pain         No Known Allergies  Past Medical History  Diagnosis Date  . No pertinent past medical history     Past Surgical  History  Procedure Date  . Colonoscopy 12/25/10    normal  . Tubal ligation   . Cataract extraction w/phaco 04/05/2011    Procedure: CATARACT EXTRACTION PHACO AND INTRAOCULAR LENS PLACEMENT (IOC);  Surgeon: Gemma Payor;  Location: [facility];  Service: Ophthalmology;  Laterality: Left;  CDE 11.84    History  Smoking status  . Former Smoker -- 0.2 packs/day for 2 years  . Types: Cigarettes  . Quit date: 03/28/1961  Smokeless tobacco  . Not on file    History  Alcohol Use  . 4.2 oz/week  . 7 Glasses of wine per week    Family History  Problem Relation Age of Onset  . Anesthesia problems Neg Hx   . Hypotension Neg Hx   . Malignant hyperthermia Neg Hx   . Pseudochol deficiency Neg Hx     Reviw of Systems:  Reviewed in the HPI.  All other systems are negative.  Physical Exam: Blood pressure 138/88, pulse 68, height 5\' 4"  (1.626 m), weight 143 lb 12.8 oz (65.227 kg). General: Well developed, well nourished, in no acute distress.  Head: Normocephalic, atraumatic, sclera non-icteric, mucus membranes are moist,   Neck: Supple. Negative for  carotid bruits. JVD not elevated.  Lungs: Clear bilaterally to auscultation without wheezes, rales, or rhonchi. Breathing is unlabored.  Heart: RRR with S1 S2. No murmurs, rubs, or gallops appreciated.  Abdomen: Soft, non-tender, non-distended with normoactive bowel sounds. No hepatomegaly. No rebound/guarding. She has a pulsitile midline mass c/w abdominal aorta.  Msk:  Strength and tone appear normal for age.  Extremities: No clubbing or cyanosis. No edema.  Distal pedal pulses are 2+ and equal bilaterally.  Neuro: Alert and oriented X 3. Moves all extremities spontaneously.  Psych:  Responds to questions appropriately with a normal affect.  ECG: NSR. No ST or T wave changes  Assessment / Plan:

## 2011-12-14 NOTE — Assessment & Plan Note (Signed)
Her chest pains sound somewhat atypical. She walks for now a day and works out on a regular basis. These episodes of chest pressure are not related to exercise. We will have her try taking Mylanta or Maalox or perhaps a Prilosec to see if that helps these chest pressures. We will have her call me sooner if she develops any episodes of chest pressure with exercise. I'll see her again in 6 months.

## 2011-12-14 NOTE — Patient Instructions (Signed)
Your physician wants you to follow-up in: 6 MONTHS You will receive a reminder letter in the mail two months in advance. If you don't receive a letter, please call our office to schedule the follow-up appointment. 

## 2012-08-16 ENCOUNTER — Encounter (HOSPITAL_COMMUNITY): Payer: Self-pay | Admitting: Pharmacy Technician

## 2012-08-28 NOTE — Patient Instructions (Addendum)
Your procedure is scheduled on: 09/04/2012  Report to [facility] at  615      AM.  Call this number if you have problems the morning of surgery: [phone]   Do not eat food or drink liquids :After Midnight.      Take these medicines the morning of surgery with A SIP OF WATER:none  Do not wear jewelry, make-up or nail polish.  Do not wear lotions, powders, or perfumes.   Do not shave 48 hours prior to surgery.  Do not bring valuables to the hospital.  Contacts, dentures or bridgework may not be worn into surgery.  Leave suitcase in the car. After surgery it may be brought to your room.  For patients admitted to the hospital, checkout time is 11:00 AM the day of discharge.   Patients discharged the day of surgery will not be allowed to drive home.  :     Please read over the following fact sheets that you were given: Coughing and Deep Breathing, Surgical Site Infection Prevention, Anesthesia Post-op Instructions and Care and Recovery After Surgery    Cataract A cataract is a clouding of the lens of the eye. When a lens becomes cloudy, vision is reduced based on the degree and nature of the clouding. Many cataracts reduce vision to some degree. Some cataracts make people more near-sighted as they develop. Other cataracts increase glare. Cataracts that are ignored and become worse can sometimes look white. The white color can be seen through the pupil. CAUSES   Aging. However, cataracts may occur at any age, even in newborns.   Certain drugs.   Trauma to the eye.   Certain diseases such as diabetes.   Specific eye diseases such as chronic inflammation inside the eye or a sudden attack of a rare form of glaucoma.   Inherited or acquired medical problems.  SYMPTOMS   Gradual, progressive drop in vision in the affected eye.   Severe, rapid visual loss. This most often happens when trauma is the cause.  DIAGNOSIS  To detect a cataract, an eye doctor examines the lens. Cataracts are  best diagnosed with an exam of the eyes with the pupils enlarged (dilated) by drops.  TREATMENT  For an early cataract, vision may improve by using different eyeglasses or stronger lighting. If that does not help your vision, surgery is the only effective treatment. A cataract needs to be surgically removed when vision loss interferes with your everyday activities, such as driving, reading, or watching TV. A cataract may also have to be removed if it prevents examination or treatment of another eye problem. Surgery removes the cloudy lens and usually replaces it with a substitute lens (intraocular lens, IOL).  At a time when both you and your doctor agree, the cataract will be surgically removed. If you have cataracts in both eyes, only one is usually removed at a time. This allows the operated eye to heal and be out of danger from any possible problems after surgery (such as infection or poor wound healing). In rare cases, a cataract may be doing damage to your eye. In these cases, your caregiver may advise surgical removal right away. The vast majority of people who have cataract surgery have better vision afterward. HOME CARE INSTRUCTIONS  If you are not planning surgery, you may be asked to do the following:  Use different eyeglasses.   Use stronger or brighter lighting.   Ask your eye doctor about reducing your medicine dose or changing  medicines if it is thought that a medicine caused your cataract. Changing medicines does not make the cataract go away on its own.   Become familiar with your surroundings. Poor vision can lead to injury. Avoid bumping into things on the affected side. You are at a higher risk for tripping or falling.   Exercise extreme care when driving or operating machinery.   Wear sunglasses if you are sensitive to bright light or experiencing problems with glare.  SEEK IMMEDIATE MEDICAL CARE IF:   You have a worsening or sudden vision loss.   You notice redness,  swelling, or increasing pain in the eye.   You have a fever.  Document Released: 07/26/2005 Document Revised: 07/15/2011 Document Reviewed: 03/19/2011 [facility] Patient Information 2012 [place], [place].PATIENT INSTRUCTIONS POST-ANESTHESIA  IMMEDIATELY FOLLOWING SURGERY:  Do not drive or operate machinery for the first twenty four hours after surgery.  Do not make any important decisions for twenty four hours after surgery or while taking narcotic pain medications or sedatives.  If you develop intractable nausea and vomiting or a severe headache please notify your doctor immediately.  FOLLOW-UP:  Please make an appointment with your surgeon as instructed. You do not need to follow up with anesthesia unless specifically instructed to do so.  WOUND CARE INSTRUCTIONS (if applicable):  Keep a dry clean dressing on the anesthesia/puncture wound site if there is drainage.  Once the wound has quit draining you may leave it open to air.  Generally you should leave the bandage intact for twenty four hours unless there is drainage.  If the epidural site drains for more than 36-48 hours please call the anesthesia department.  QUESTIONS?:  Please feel free to call your physician or the hospital operator if you have any questions, and they will be happy to assist you.

## 2012-08-29 ENCOUNTER — Encounter (HOSPITAL_COMMUNITY): Payer: Self-pay

## 2012-08-29 ENCOUNTER — Encounter (HOSPITAL_COMMUNITY)
Admission: RE | Admit: 2012-08-29 | Discharge: 2012-08-29 | Disposition: A | Discharge status: Home / Self Care | Payer: BC Managed Care – PPO | Source: Ambulatory Visit | Attending: Ophthalmology | Admitting: Ophthalmology

## 2012-08-29 LAB — HEMOGLOBIN AND HEMATOCRIT, BLOOD: HCT: 40.5 % (ref 36.0–46.0)

## 2012-08-29 LAB — BASIC METABOLIC PANEL
BUN: 16 mg/dL (ref 6–23)
CO2: 31 mEq/L (ref 19–32)
Calcium: 9.8 mg/dL (ref 8.4–10.5)
Chloride: 102 mEq/L (ref 96–112)
Creatinine, Ser: 0.97 mg/dL (ref 0.50–1.10)
Glucose, Bld: 77 mg/dL (ref 70–99)

## 2012-09-01 MED ORDER — CYCLOPENTOLATE-PHENYLEPHRINE 0.2-1 % OP SOLN
OPHTHALMIC | Status: AC
  Filled 2012-09-01: qty 2

## 2012-09-01 MED ORDER — TETRACAINE HCL 0.5 % OP SOLN
OPHTHALMIC | Status: AC
  Filled 2012-09-01: qty 2

## 2012-09-01 MED ORDER — LIDOCAINE HCL (PF) 1 % IJ SOLN
INTRAMUSCULAR | Status: AC
  Filled 2012-09-01: qty 2

## 2012-09-01 MED ORDER — NEOMYCIN-POLYMYXIN-DEXAMETH 3.5-10000-0.1 OP OINT
TOPICAL_OINTMENT | OPHTHALMIC | Status: AC
  Filled 2012-09-01: qty 3.5

## 2012-09-01 MED ORDER — PHENYLEPHRINE HCL 2.5 % OP SOLN
OPHTHALMIC | Status: AC
  Filled 2012-09-01: qty 2

## 2012-09-01 MED ORDER — LIDOCAINE HCL 3.5 % OP GEL
OPHTHALMIC | Status: AC
  Filled 2012-09-01: qty 5

## 2012-09-04 ENCOUNTER — Encounter (HOSPITAL_COMMUNITY): Payer: Self-pay | Admitting: Anesthesiology

## 2012-09-04 ENCOUNTER — Encounter (HOSPITAL_COMMUNITY): Payer: Self-pay | Admitting: *Deleted

## 2012-09-04 ENCOUNTER — Ambulatory Visit (HOSPITAL_COMMUNITY): Payer: BC Managed Care – PPO | Admitting: Anesthesiology

## 2012-09-04 ENCOUNTER — Encounter (HOSPITAL_COMMUNITY): Payer: Self-pay | Admitting: Ophthalmology

## 2012-09-04 ENCOUNTER — Ambulatory Visit (HOSPITAL_COMMUNITY)
Admission: RE | Admit: 2012-09-04 | Discharge: 2012-09-04 | Disposition: A | Discharge status: Home / Self Care | Payer: BC Managed Care – PPO | Source: Ambulatory Visit | Attending: Ophthalmology | Admitting: Ophthalmology

## 2012-09-04 ENCOUNTER — Encounter (HOSPITAL_COMMUNITY)
Admission: RE | Disposition: A | Discharge status: Home / Self Care | Payer: Self-pay | Source: Ambulatory Visit | Attending: Ophthalmology

## 2012-09-04 DIAGNOSIS — H2589 Other age-related cataract: Secondary | ICD-10-CM | POA: Insufficient documentation

## 2012-09-04 DIAGNOSIS — Z01810 Encounter for preprocedural cardiovascular examination: Secondary | ICD-10-CM | POA: Insufficient documentation

## 2012-09-04 DIAGNOSIS — Z01812 Encounter for preprocedural laboratory examination: Secondary | ICD-10-CM | POA: Insufficient documentation

## 2012-09-04 HISTORY — PX: CATARACT EXTRACTION W/PHACO: SHX586

## 2012-09-04 SURGERY — PHACOEMULSIFICATION, CATARACT, WITH IOL INSERTION
Anesthesia: Monitor Anesthesia Care | Site: Eye | Laterality: Right | Wound class: Clean

## 2012-09-04 MED ORDER — TETRACAINE HCL 0.5 % OP SOLN
1.0000 [drp] | OPHTHALMIC | Status: AC
  Administered 2012-09-04 (×3): 1 [drp] via OPHTHALMIC

## 2012-09-04 MED ORDER — EPINEPHRINE HCL 1 MG/ML IJ SOLN
INTRAMUSCULAR | Status: AC
  Filled 2012-09-04: qty 1

## 2012-09-04 MED ORDER — POVIDONE-IODINE 5 % OP SOLN
OPHTHALMIC | Status: DC | PRN
  Administered 2012-09-04: 1 via OPHTHALMIC

## 2012-09-04 MED ORDER — BSS IO SOLN
INTRAOCULAR | Status: DC | PRN
  Administered 2012-09-04: 15 mL via INTRAOCULAR

## 2012-09-04 MED ORDER — MIDAZOLAM HCL 2 MG/2ML IJ SOLN
1.0000 mg | INTRAMUSCULAR | Status: DC | PRN
  Administered 2012-09-04: 2 mg via INTRAVENOUS

## 2012-09-04 MED ORDER — EPINEPHRINE HCL 1 MG/ML IJ SOLN
INTRAOCULAR | Status: DC | PRN
  Administered 2012-09-04: 08:00:00

## 2012-09-04 MED ORDER — LIDOCAINE 3.5 % OP GEL OPTIME - NO CHARGE
OPHTHALMIC | Status: DC | PRN
  Administered 2012-09-04: 1 [drp] via OPHTHALMIC

## 2012-09-04 MED ORDER — NEOMYCIN-POLYMYXIN-DEXAMETH 0.1 % OP OINT
TOPICAL_OINTMENT | OPHTHALMIC | Status: DC | PRN
  Administered 2012-09-04: 1 via OPHTHALMIC

## 2012-09-04 MED ORDER — LIDOCAINE HCL (PF) 1 % IJ SOLN
INTRAMUSCULAR | Status: DC | PRN
  Administered 2012-09-04: .4 mL

## 2012-09-04 MED ORDER — PHENYLEPHRINE HCL 2.5 % OP SOLN
1.0000 [drp] | OPHTHALMIC | Status: AC
  Administered 2012-09-04 (×3): 1 [drp] via OPHTHALMIC

## 2012-09-04 MED ORDER — MIDAZOLAM HCL 2 MG/2ML IJ SOLN
INTRAMUSCULAR | Status: AC
  Filled 2012-09-04: qty 2

## 2012-09-04 MED ORDER — PROVISC 10 MG/ML IO SOLN
INTRAOCULAR | Status: DC | PRN
  Administered 2012-09-04: 8.5 mg via INTRAOCULAR

## 2012-09-04 MED ORDER — LACTATED RINGERS IV SOLN
INTRAVENOUS | Status: DC
  Administered 2012-09-04: 07:00:00 via INTRAVENOUS

## 2012-09-04 MED ORDER — LIDOCAINE HCL 3.5 % OP GEL
1.0000 "application " | Freq: Once | OPHTHALMIC | Status: AC
  Administered 2012-09-04: 1 via OPHTHALMIC

## 2012-09-04 MED ORDER — CYCLOPENTOLATE-PHENYLEPHRINE 0.2-1 % OP SOLN
1.0000 [drp] | OPHTHALMIC | Status: AC
  Administered 2012-09-04 (×3): 1 [drp] via OPHTHALMIC

## 2012-09-04 SURGICAL SUPPLY — 11 items
CLOTH BEACON ORANGE TIMEOUT ST (SAFETY) ×2 IMPLANT
EYE SHIELD UNIVERSAL CLEAR (GAUZE/BANDAGES/DRESSINGS) ×2 IMPLANT
GLOVE BIOGEL PI IND STRL 6.5 (GLOVE) ×1 IMPLANT
GLOVE BIOGEL PI INDICATOR 6.5 (GLOVE) ×1
GLOVE EXAM NITRILE MD LF STRL (GLOVE) ×2 IMPLANT
PAD ARMBOARD 7.5X6 YLW CONV (MISCELLANEOUS) ×2 IMPLANT
SIGHTPATH CAT PROC W REG LENS (Ophthalmic Related) ×2 IMPLANT
SYR TB 1ML LL NO SAFETY (SYRINGE) ×2 IMPLANT
TAPE SURG TRANSPORE 1 IN (GAUZE/BANDAGES/DRESSINGS) ×1 IMPLANT
TAPE SURGICAL TRANSPORE 1 IN (GAUZE/BANDAGES/DRESSINGS) ×1
WATER STERILE IRR 250ML POUR (IV SOLUTION) ×2 IMPLANT

## 2012-09-04 NOTE — Anesthesia Postprocedure Evaluation (Signed)
  Anesthesia Post-op Note  Patient: Amanda Vang  Procedure(s) Performed: Procedure(s) (LRB): CATARACT EXTRACTION PHACO AND INTRAOCULAR LENS PLACEMENT (IOC) (Right)  Patient Location:  Short Stay  Anesthesia Type: MAC  Level of Consciousness: awake  Airway and Oxygen Therapy: Patient Spontanous Breathing  Post-op Pain: none  Post-op Assessment: Post-op Vital signs reviewed, Patient's Cardiovascular Status Stable, Respiratory Function Stable, Patent Airway, No signs of Nausea or vomiting and Pain level controlled  Post-op Vital Signs: Reviewed and stable  Complications: No apparent anesthesia complications

## 2012-09-04 NOTE — Transfer of Care (Signed)
Immediate Anesthesia Transfer of Care Note  Patient: Amanda Vang  Procedure(s) Performed: Procedure(s) (LRB): CATARACT EXTRACTION PHACO AND INTRAOCULAR LENS PLACEMENT (IOC) (Right)  Patient Location: Shortstay  Anesthesia Type: MAC  Level of Consciousness: awake  Airway & Oxygen Therapy: Patient Spontanous Breathing   Post-op Assessment: Report given to PACU RN, Post -op Vital signs reviewed and stable and Patient moving all extremities  Post vital signs: Reviewed and stable  Complications: No apparent anesthesia complications

## 2012-09-04 NOTE — Anesthesia Preprocedure Evaluation (Signed)
Anesthesia Evaluation  Patient identified by MRN, date of birth, ID band Patient awake    Reviewed: Allergy & Precautions, H&P , NPO status , Patient's Chart, lab work & pertinent test results  Airway Mallampati: I TM Distance: >3 FB Neck ROM: Full    Dental  (+) Teeth Intact   Pulmonary neg pulmonary ROS,    Pulmonary exam normal       Cardiovascular negative cardio ROS  Rhythm:Regular Rate:Normal     Neuro/Psych negative neurological ROS  negative psych ROS   GI/Hepatic negative GI ROS, Neg liver ROS,   Endo/Other  negative endocrine ROS  Renal/GU negative Renal ROS     Musculoskeletal negative musculoskeletal ROS (+)   Abdominal Normal abdominal exam  (+)   Peds  Hematology negative hematology ROS (+)   Anesthesia Other Findings   Reproductive/Obstetrics                           Anesthesia Physical Anesthesia Plan  ASA: II  Anesthesia Plan: MAC   Post-op Pain Management:    Induction: Intravenous  Airway Management Planned: Nasal Cannula  Additional Equipment:   Intra-op Plan:   Post-operative Plan:   Informed Consent: I have reviewed the patients History and Physical, chart, labs and discussed the procedure including the risks, benefits and alternatives for the proposed anesthesia with the patient or authorized representative who has indicated his/her understanding and acceptance.     Plan Discussed with: CRNA  Anesthesia Plan Comments:         Anesthesia Quick Evaluation

## 2012-09-04 NOTE — Anesthesia Procedure Notes (Signed)
Procedure Name: MAC Date/Time: 09/04/2012 7:25 AM Performed by: Franco Nones Pre-anesthesia Checklist: Patient identified, Emergency Drugs available, Suction available, Timeout performed and Patient being monitored Patient Re-evaluated:Patient Re-evaluated prior to inductionOxygen Delivery Method: Nasal Cannula

## 2012-09-04 NOTE — Op Note (Signed)
[name], [name]               ACCOUNT NO.:  [id number]  MEDICAL RECORD NO.:  [id number]  LOCATION:  [facility]                          FACILITY:  [facility]  PHYSICIAN:  Susanne Greenhouse, MD       DATE OF BIRTH:  03-15-1951  DATE OF PROCEDURE:  09/04/2012 DATE OF DISCHARGE:                              OPERATIVE REPORT   PREOPERATIVE DIAGNOSIS:  Combined cataract, right eye, diagnosis code 366.19.  POSTOPERATIVE DIAGNOSIS:  Combined cataract, right eye, diagnosis code 366.19.  OPERATION PERFORMED:  Phacoemulsification with posterior chamber intraocular lens implantation, right eye.  SURGEON:  Bonne Dolores. Vardaan Depascale, MD  ANESTHESIA:  Topical with monitored anesthesia care and IV sedation.  OPERATIVE SUMMARY:  In the preoperative area, dilating drops were placed into the right eye.  The patient was then brought into the operating room where she was placed under general anesthesia.  The eye was then prepped and draped.  Beginning with a 75 blade, a paracentesis port was made at the surgeon's 2 o'clock position.  The anterior chamber was then filled with a 1% nonpreserved lidocaine solution with epinephrine.  This was followed by Viscoat to deepen the chamber.  A small fornix-based peritomy was performed superiorly.  Next, a single iris hook was placed through the limbus superiorly.  A 2.4-mm keratome blade was then used to make a clear corneal incision over the iris hook.  A bent cystotome needle and Utrata forceps were used to create a continuous tear capsulotomy.  Hydrodissection was performed using balanced salt solution on a fine cannula.  The lens nucleus was then removed using phacoemulsification in a quadrant cracking technique.  The cortical material was then removed with irrigation and aspiration.  The capsular bag and anterior chamber were refilled with Provisc.  The wound was widened to approximately 3 mm and a posterior chamber intraocular lens was placed into the capsular bag without  difficulty using an [employer] lens injecting system.  A single 10-0 nylon suture was then used to close the incision as well as stromal hydration.  The Provisc was removed from the anterior chamber and capsular bag with irrigation and aspiration.  At this point, the wounds were tested for leak, which were negative.  The anterior chamber remained deep and stable.  The patient tolerated the procedure well.  There were no operative complications, and she awoke from general anesthesia without problem.  No surgical specimens.  Prosthetic device used Lenstec posterior chamber lens, model Softtech HD, power of 19.5, serial number is [id number].          ______________________________ Susanne Greenhouse, MD     KEH/MEDQ  D:  09/04/2012  T:  09/04/2012  Job:  [id number]

## 2012-09-04 NOTE — H&P (Signed)
I have reviewed the H&P, the patient was re-examined, and I have identified no interval changes in medical condition and plan of care since the history and physical of record  

## 2012-09-04 NOTE — Brief Op Note (Signed)
Pre-Op Dx: Cataract OD Post-Op Dx: Cataract OD Surgeon: Kiyan Burmester Anesthesia: Topical with MAC Surgery: Cataract Extraction with Intraocular lens Implant OD Implant: Lenstec, Model Softec HD Blood Loss: None Specimen: None Complications: None 

## 2012-09-04 NOTE — Addendum Note (Signed)
Addendum  created 09/04/12 0750 by Roselie Awkward, MD   Modules edited:Anesthesia Attestations

## 2012-09-04 NOTE — Addendum Note (Signed)
Addendum  created 09/04/12 0750 by Malinda Mayden, MD   Modules edited:Anesthesia Attestations    

## 2012-09-05 ENCOUNTER — Encounter (HOSPITAL_COMMUNITY): Payer: Self-pay | Admitting: Ophthalmology

## 2013-12-19 ENCOUNTER — Other Ambulatory Visit (HOSPITAL_COMMUNITY): Payer: Self-pay | Admitting: Internal Medicine

## 2013-12-19 ENCOUNTER — Ambulatory Visit (HOSPITAL_COMMUNITY)
Admission: RE | Admit: 2013-12-19 | Discharge: 2013-12-19 | Disposition: A | Discharge status: Home / Self Care | Payer: BC Managed Care – PPO | Source: Ambulatory Visit | Attending: Internal Medicine | Admitting: Internal Medicine

## 2013-12-19 DIAGNOSIS — M79609 Pain in unspecified limb: Secondary | ICD-10-CM | POA: Insufficient documentation

## 2013-12-19 DIAGNOSIS — M79646 Pain in unspecified finger(s): Secondary | ICD-10-CM

## 2014-10-09 ENCOUNTER — Ambulatory Visit (HOSPITAL_COMMUNITY): Payer: Medicare Other | Attending: Orthopedic Surgery | Admitting: Physical Therapy

## 2014-10-09 ENCOUNTER — Encounter (HOSPITAL_COMMUNITY): Payer: Self-pay | Admitting: Physical Therapy

## 2014-10-09 DIAGNOSIS — M25561 Pain in right knee: Secondary | ICD-10-CM

## 2014-10-09 DIAGNOSIS — M25562 Pain in left knee: Secondary | ICD-10-CM | POA: Diagnosis not present

## 2014-10-09 DIAGNOSIS — R269 Unspecified abnormalities of gait and mobility: Secondary | ICD-10-CM | POA: Insufficient documentation

## 2014-10-09 DIAGNOSIS — M6281 Muscle weakness (generalized): Secondary | ICD-10-CM | POA: Insufficient documentation

## 2014-10-09 DIAGNOSIS — R29898 Other symptoms and signs involving the musculoskeletal system: Secondary | ICD-10-CM

## 2014-10-09 NOTE — Therapy (Addendum)
[facility] [address] [place], [place], [address] Phone: [phone]   Fax:  [phone]  Physical Therapy Evaluation  Patient Details  Name: Amanda Vang MRN: [id number] Date of Birth: 1950-11-11 Referring Provider:  Garnet Koyanagi, *  Encounter Date: 10/09/2014      PT End of Session - 10/09/14 0909    Visit Number 1   Number of Visits 8   Date for PT Re-Evaluation 10/30/14   Authorization Type UMR/UHC PPO   Authorization Time Period 10/09/14 to 12/09/14   Authorization - Visit Number 1   Activity Tolerance Patient tolerated treatment well   Behavior During Therapy [facility] for tasks assessed/performed      Past Medical History  Diagnosis Date  . No pertinent past medical history     Past Surgical History  Procedure Laterality Date  . Colonoscopy  12/25/10    normal  . Tubal ligation    . Cataract extraction w/phaco  04/05/2011    Procedure: CATARACT EXTRACTION PHACO AND INTRAOCULAR LENS PLACEMENT (IOC);  Surgeon: Gemma Payor;  Location: [facility];  Service: Ophthalmology;  Laterality: Left;  CDE 11.84  . Cataract extraction w/phaco  09/04/2012    Procedure: CATARACT EXTRACTION PHACO AND INTRAOCULAR LENS PLACEMENT (IOC);  Surgeon: Gemma Payor, MD;  Location: [facility];  Service: Ophthalmology;  Laterality: Right;  CDE=14.70    There were no vitals taken for this visit.  Visit Diagnosis:  Knee pain, bilateral - Plan: PT plan of care cert/re-cert  Weakness of both hips - Plan: PT plan of care cert/re-cert  Abnormality of gait - Plan: PT plan of care cert/re-cert      Subjective Assessment - 10/09/14 0805    Symptoms burning on anterior medial knee bilaterally, pocket of fluid bilaterally; knees hurt the worst when lying on side. Being active also bothers knees quite a bit.    Pertinent History patient plays a lot of golf; hurt left knee getting out of golf cart and twisted knee. No meniscus or other cartilage tears. About  three weeks later, the right knee began hurting and edema and pain began showing up in both knees.    How long can you sit comfortably? unlimited   How long can you stand comfortably? unlimited   How long can you walk comfortably? depends on speed- if going slow, can walk about 30 min. Cannot walk normal pace without pain.    Patient Stated Goals get rid of pain, get back to normal activities    Currently in Pain? No/denies          [facility] PT Assessment - 10/09/14 0001    Assessment   Onset Date 04/23/14   Next MD Visit Pt to call MD if PT doesn't work; if PT does not work, patient has appointment in 6 weeks   Balance Screen   Has the patient fallen in the past 6 months No   Has the patient had a decrease in activity level because of a fear of falling?  Yes   Is the patient reluctant to leave their home because of a fear of falling?  No   AROM   Right Hip External Rotation  40   Right Hip Internal Rotation  35   Left Hip External Rotation  40   Left Hip Internal Rotation  38   Right Knee Extension -2   Right Knee Flexion 131   Left Knee Extension -1   Left Knee Flexion 132  pain antero-medial  knee   Right Ankle Dorsiflexion 11   Left Ankle Dorsiflexion 13   Strength   Right Hip Flexion 4-/5   Right Hip Extension 3+/5   Right Hip ABduction 3+/5   Left Hip Flexion 4-/5   Left Hip Extension 3+/5   Left Hip ABduction 4-/5   Right Knee Flexion 4+/5   Right Knee Extension 4+/5   Left Knee Flexion 4+/5   Left Knee Extension 4+/5   Right Ankle Dorsiflexion 5/5   Left Ankle Dorsiflexion 5/5   Flexibility   Quadriceps Bilateral tightness and positive ELy's bilateral                  [facility] Adult PT Treatment/Exercise - 10/09/14 0001    Knee/Hip Exercises: Stretches   Active Hamstring Stretch 3 reps;30 seconds   Active Hamstring Stretch Limitations 14" box, 3-way   Piriformis Stretch 2 reps;30 seconds   Piriformis Stretch Limitations seated   Gastroc Stretch 3  reps;30 seconds   Gastroc Stretch Limitations slantboard   Knee/Hip Exercises: Supine   Bridges AROM;Both;1 set;15 reps   Knee/Hip Exercises: Sidelying   Hip ABduction Both;1 set;10 reps   Knee/Hip Exercises: Prone   Other Prone Exercises Hip extensions 1x10                PT Education - 10/09/14 0831    Education provided Yes   Education Details Prognosis, overall PT plan of care, HEP   Person(s) Educated Patient   Methods Explanation   Comprehension Verbalized understanding;Returned demonstration          PT Short Term Goals - 10/09/14 0857    PT SHORT TERM GOAL #1   Title Patient will demonstrate the ability to perform functional dynamic gait and sports activities for at least one hour with pain no more than 2/10 bilateral knees   Time 2   Period Weeks   Status New   PT SHORT TERM GOAL #2   Title Patient will demonstrate an increase of at least one muscle grade in hip abductors and extensors in order to improve overall hip stability and reduce knee pain   Time 2   Period Weeks   Status New   PT SHORT TERM GOAL #3   Title Patient will demonstrate the ability to ascend/descend full flight of stairs with no railings, good mechanics, pain no more than 2/10 bilateral knees   Time 2   Period Weeks   Status New   PT SHORT TERM GOAL #4   Title Patient will consistently and correctly perform HEP in order to assist in reducing impairments and reducing  bilateral knee pain   Time 2   Period Weeks   Status New           PT Long Term Goals - 10/09/14 0859    PT LONG TERM GOAL #1   Title Patient will demonstrate the ability to perform dynamic gait and sports activities for at least 4 hours with bilateral knee pain no more than 1/10   Time 4   Period Weeks   Status New   PT LONG TERM GOAL #2   Title Patient will demonstrate ability to perform full depth squat with bilateral knee pain no more than 1/10 in order to facilitate participation in dynamic sports tasks    Time 4   Period Weeks   Status New   PT LONG TERM GOAL #3   Title Patient will demonstrate the ability to correctly perform golfers squat as well as  golf swings with good form and at least 10 times with bilateral knee pain no more than 1/10 to facilitate participation in sports   Time 4   Period Weeks   Status New   PT LONG TERM GOAL #4   Title Patient will demonstrate ability to correctly and consistently perform advanced HEP to maintain gains achieved in skilled PT and reduce risk of recurrence of pain   Time 4   Period Weeks   Status New               Plan - 10/09/14 0910    Clinical Impression Statement Patient presents with pain bilateraly knees primarily with activity, as well as reduced range of motion bilateral ankles and hips. Patient also displays significant weakness in hip abductors and extensors, along with muscle tightness in calves and piriformis groups. Patient will benefit from skilled PT services in order to reduce bilateral knee pain, improve overall strength and muscle flexibility, and improve ability to participate in functional sports activities on a pain free basis.   Pt will benefit from skilled therapeutic intervention in order to improve on the following deficits Abnormal gait;Decreased activity tolerance;Decreased strength;Impaired flexibility;Decreased balance;Decreased mobility;Decreased range of motion;Pain;Increased edema;Decreased coordination   Rehab Potential Good   PT Frequency 2x / week   PT Duration 4 weeks   PT Treatment/Interventions ADLs/Self Care Home Management;Gait training;Neuromuscular re-education;Ultrasound;Stair training;Functional mobility training;Patient/family education;Therapeutic activities;Cryotherapy;Electrical Stimulation;Therapeutic exercise;Manual techniques;Balance training   PT Next Visit Plan functional stretches and strengthening with focus on hip strength; dynamic balance; modalities if needed   PT Home Exercise Plan given    Consulted and Agree with Plan of Care Patient     G-Codes 10/09/14 Based on skilled clinical assessment of pain, strength, mobility, functional task performance, gait mechanics  Category: Mobility- Moving and Walking Around Current: CK (40-60% impaired or restricted) Goal: CJ (20-40% impaired or restricted)     Problem List Patient Active Problem List   Diagnosis Date Noted  . Chest pain 09/28/2011    Nedra HaiKristen Unger PT, DPT [phone]  [facility] [address] [place], [place], [address] Phone: [phone]   Fax:  [phone]

## 2014-10-09 NOTE — Patient Instructions (Signed)
Achilles Tendon Stretch   Stand with hands supported on wall, elbows slightly bent, feet parallel and both heels on floor, front knee bent, back knee straight. Slowly relax back knee until a stretch is felt in achilles tendon. Hold _30___ seconds. Repeat with leg positions switched. Perform 2 repetitions each leg. Do two sets of stretch per day.   Copyright  VHI. All rights reserved.  Abduction Lift   Lie on side. Tighten muscles on outside of hip to raise top limb off of table, making sure to keep toes facing forward.  Hold __2__ seconds. Repeat __10__ times. Do _2___ sessions per day.  Copyright  VHI. All rights reserved.  (Home) Extension: Hip   With support under abdomen, tighten stomach. Lift right leg in line with body. Do not hyperextend. Alternate legs. Repeat __10__ times per set. Do __2__ sets per session. Do __2__ sessions per day.  Copyright  VHI. All rights reserved.  Bridge   Lie back, legs bent. Inhale, pressing hips up. Keeping ribs in, lengthen lower back. Exhale, rolling down along spine from top. Repeat __15__ times. Do ___2_ sessions per day.  Copyright  VHI. All rights reserved.

## 2014-10-22 ENCOUNTER — Ambulatory Visit (HOSPITAL_COMMUNITY): Payer: Medicare Other | Admitting: Physical Therapy

## 2014-10-22 DIAGNOSIS — M25561 Pain in right knee: Secondary | ICD-10-CM

## 2014-10-22 DIAGNOSIS — R29898 Other symptoms and signs involving the musculoskeletal system: Secondary | ICD-10-CM

## 2014-10-22 DIAGNOSIS — M25562 Pain in left knee: Secondary | ICD-10-CM | POA: Diagnosis not present

## 2014-10-22 DIAGNOSIS — R269 Unspecified abnormalities of gait and mobility: Secondary | ICD-10-CM

## 2014-10-22 NOTE — Therapy (Signed)
[place] [facility] [address] [place], [place], [address] Phone: [phone]   Fax:  [phone]  Physical Therapy Treatment  Patient Details  Name: Amanda Vang MRN: [id number] Date of Birth: 07/02/1951 Referring Provider:  Catalina Pizza, MD  Encounter Date: 10/22/2014      PT End of Session - 10/22/14 1227    Visit Number 2   Number of Visits 8   Date for PT Re-Evaluation 10/30/14   Authorization Type UMR/UHC PPO   Authorization Time Period 10/09/14 to 12/09/14   Authorization - Visit Number 2   PT Start Time 0848   PT Stop Time 0932   PT Time Calculation (min) 44 min   Activity Tolerance Patient tolerated treatment well   Behavior During Therapy [facility] for tasks assessed/performed      Past Medical History  Diagnosis Date  . No pertinent past medical history     Past Surgical History  Procedure Laterality Date  . Colonoscopy  12/25/10    normal  . Tubal ligation    . Cataract extraction w/phaco  04/05/2011    Procedure: CATARACT EXTRACTION PHACO AND INTRAOCULAR LENS PLACEMENT (IOC);  Surgeon: Gemma Payor;  Location: [facility];  Service: Ophthalmology;  Laterality: Left;  CDE 11.84  . Cataract extraction w/phaco  09/04/2012    Procedure: CATARACT EXTRACTION PHACO AND INTRAOCULAR LENS PLACEMENT (IOC);  Surgeon: Gemma Payor, MD;  Location: [facility];  Service: Ophthalmology;  Laterality: Right;  CDE=14.70    There were no vitals filed for this visit.  Visit Diagnosis:  Knee pain, bilateral  Weakness of both hips  Abnormality of gait      Subjective Assessment - 10/22/14 1224    Symptoms Patient doing very well today and had a good weekend, however 7/10 pain bilaterally   Pertinent History patient plays a lot of golf; hurt left knee getting out of golf cart and twisted knee. No meniscus or other cartilage tears. About three weeks later, the right knee began hurting and edema and pain began showing up in both knees.    Currently in  Pain? Yes   Pain Score 7    Pain Location Knee   Pain Orientation --  bilateral                       [facility] Adult PT Treatment/Exercise - 10/22/14 0001    Knee/Hip Exercises: Stretches   Active Hamstring Stretch 3 reps;30 seconds   Active Hamstring Stretch Limitations 14 inch box, 3 way   Quad Stretch 2 reps;30 seconds   Quad Stretch Limitations prone   Piriformis Stretch 2 reps;30 seconds   Piriformis Stretch Limitations seated   Gastroc Stretch 3 reps;30 seconds   Gastroc Stretch Limitations slantboard, 3 way   Knee/Hip Exercises: Standing   Forward Lunges Both;1 set;10 reps   Forward Lunges Limitations 6 inch box   Other Standing Knee Exercises Star reaches on stable surface 1x7; Hip IR walk-arounds 1x5 each side   Other Standing Knee Exercises 3D hip excursions with toe touch 1x10   Knee/Hip Exercises: Supine   Bridges AROM;1 set;Right;Left;10 reps   Straight Leg Raises 1 set;15 reps   Knee/Hip Exercises: Sidelying   Hip ABduction Both;1 set;15 reps   Knee/Hip Exercises: Prone   Hip Extension Both;1 set;10 reps                  PT Short Term Goals - 10/09/14 0857    PT SHORT TERM  GOAL #1   Title Patient will demonstrate the ability to perform functional dynamic gait and sports activities for at least one hour with pain no more than 2/10 bilateral knees   Time 2   Period Weeks   Status New   PT SHORT TERM GOAL #2   Title Patient will demonstrate an increase of at least one muscle grade in hip abductors and extensors in order to improve overall hip stability and reduce knee pain   Time 2   Period Weeks   Status New   PT SHORT TERM GOAL #3   Title Patient will demonstrate the ability to ascend/descend full flight of stairs with no railings, good mechanics, pain no more than 2/10 bilateral knees   Time 2   Period Weeks   Status New   PT SHORT TERM GOAL #4   Title Patient will consistently and correctly perform HEP in order to assist in  reducing impairments and reducing  bilateral knee pain   Time 2   Period Weeks   Status New           PT Long Term Goals - 10/09/14 0859    PT LONG TERM GOAL #1   Title Patient will demonstrate the ability to perform dynamic gait and sports activities for at least 4 hours with bilateral knee pain no more than 1/10   Time 4   Period Weeks   Status New   PT LONG TERM GOAL #2   Title Patient will demonstrate ability to perform full depth squat with bilateral knee pain no more than 1/10 in order to facilitate participation in dynamic sports tasks   Time 4   Period Weeks   Status New   PT LONG TERM GOAL #3   Title Patient will demonstrate the ability to correctly perform golfers squat as well as golf swings with good form and at least 10 times with bilateral knee pain no more than 1/10 to facilitate participation in sports   Time 4   Period Weeks   Status New   PT LONG TERM GOAL #4   Title Patient will demonstrate ability to correctly and consistently perform advanced HEP to maintain gains achieved in skilled PT and reduce risk of recurrence of pain   Time 4   Period Weeks   Status New               Plan - 10/22/14 1227    Clinical Impression Statement Patient tolerated today's session well, demonstrates good form throughout. Pain initally 7/10 both sides, reduced to 0/10 with hip motion and exercises. Patient required eduation regarding need for monitoring during skilled PT sessions for good form with new exercises.    Pt will benefit from skilled therapeutic intervention in order to improve on the following deficits Abnormal gait;Decreased activity tolerance;Decreased strength;Impaired flexibility;Decreased balance;Decreased mobility;Decreased range of motion;Pain;Increased edema;Decreased coordination   Rehab Potential Good   PT Frequency 2x / week   PT Duration 4 weeks   PT Treatment/Interventions ADLs/Self Care Home Management;Gait training;Neuromuscular  re-education;Ultrasound;Stair training;Functional mobility training;Patient/family education;Therapeutic activities;Cryotherapy;Electrical Stimulation;Therapeutic exercise;Manual techniques;Balance training   PT Next Visit Plan functional stretches and strengthening with focus on hip strength; dynamic balance; modalities if needed   Consulted and Agree with Plan of Care Patient        Problem List Patient Active Problem List   Diagnosis Date Noted  . Chest pain 09/28/2011    Nedra HaiKristen Unger PT, DPT [phone]  [facility] [address] [place], [place], [address] Phone: [phone]   Fax:  [phone]

## 2014-10-22 NOTE — Patient Instructions (Signed)
Flexion: Stretch - Quadriceps (Prone)   Position Helper: Place one hand on shin near left ankle. Stabilize buttock to keep hip flat on bed. Motion - Helper presses heel toward buttock slowly. CAUTION: Patient should feel stretch along front of thigh. There should be no pain in knee joint. Hold ___ seconds. Repeat ___ times. Repeat with other leg. Do ___ sessions per day. Variation: Contract method: Resist ___ seconds. (see card G.G.-14)  Copyright  VHI. All rights reserved.

## 2014-10-24 ENCOUNTER — Ambulatory Visit (HOSPITAL_COMMUNITY): Payer: Medicare Other

## 2014-10-24 DIAGNOSIS — M25561 Pain in right knee: Secondary | ICD-10-CM

## 2014-10-24 DIAGNOSIS — M25562 Pain in left knee: Principal | ICD-10-CM

## 2014-10-24 DIAGNOSIS — R29898 Other symptoms and signs involving the musculoskeletal system: Secondary | ICD-10-CM

## 2014-10-24 DIAGNOSIS — R269 Unspecified abnormalities of gait and mobility: Secondary | ICD-10-CM

## 2014-10-24 NOTE — Patient Instructions (Addendum)
Squats complete with one toe touching for gluteal strengthening  Golfer squat with theraball to one side with squat then overhead   Side step with resistance band

## 2014-10-24 NOTE — Therapy (Signed)
[place] [facility] [address] [place], [place], [address] Phone: [phone]   Fax:  [phone]  Physical Therapy Treatment  Patient Details  Name: Amanda Vang MRN: [id number] Date of Birth: Oct 09, 1950 Referring Provider:  Catalina Pizza, MD  Encounter Date: 10/24/2014      PT End of Session - 10/24/14 0905    Visit Number 3   Number of Visits 8   Date for PT Re-Evaluation 10/30/14   Authorization Type UMR/UHC PPO   Authorization Time Period 10/09/14 to 12/09/14   Authorization - Visit Number 3   PT Start Time 0848   PT Stop Time 0930   PT Time Calculation (min) 42 min   Activity Tolerance Patient tolerated treatment well   Behavior During Therapy [facility] for tasks assessed/performed      Past Medical History  Diagnosis Date  . No pertinent past medical history     Past Surgical History  Procedure Laterality Date  . Colonoscopy  12/25/10    normal  . Tubal ligation    . Cataract extraction w/phaco  04/05/2011    Procedure: CATARACT EXTRACTION PHACO AND INTRAOCULAR LENS PLACEMENT (IOC);  Surgeon: Gemma Payor;  Location: [facility];  Service: Ophthalmology;  Laterality: Left;  CDE 11.84  . Cataract extraction w/phaco  09/04/2012    Procedure: CATARACT EXTRACTION PHACO AND INTRAOCULAR LENS PLACEMENT (IOC);  Surgeon: Gemma Payor, MD;  Location: [facility];  Service: Ophthalmology;  Laterality: Right;  CDE=14.70    There were no vitals filed for this visit.  Visit Diagnosis:  Knee pain, bilateral  Weakness of both hips  Abnormality of gait      Subjective Assessment - 10/24/14 0849    Symptoms Pt statead she is pain free currently following exercises and stretches.  Pt minimal on medial region of Lt knee she believes is related to way she slept last night.  Pt stated she begins golf next week.   Currently in Pain? Yes   Pain Score 3    Pain Location Knee   Pain Orientation Left   Pain Descriptors / Indicators Dull            [facility]  PT Assessment - 10/24/14 0001    Assessment   Onset Date 04/23/14   Next MD Visit Pt to call MD if PT doesn't work; if PT does not work, patient has appointment in 6 weeks                   [facility] Adult PT Treatment/Exercise - 10/24/14 0903    Exercises   Exercises Knee/Hip   Knee/Hip Exercises: Stretches   Active Hamstring Stretch 3 reps;30 seconds   Active Hamstring Stretch Limitations 14 inch box, 3 way   Quad Stretch 3 reps;30 seconds   Quad Stretch Limitations prone with rope   Piriformis Stretch 3 reps;30 seconds   Piriformis Stretch Limitations seated   Gastroc Stretch 3 reps;30 seconds   Gastroc Stretch Limitations slantboard, 3 way   Knee/Hip Exercises: Standing   Forward Lunges Both;15 reps   Forward Lunges Limitations 4 in step   Side Lunges Both;10 reps   Side Lunges Limitations 4in step   Functional Squat 10 reps   Functional Squat Limitations golfer's squat with yellow theraball   Other Standing Knee Exercises Side stepping with redtband   Other Standing Knee Exercises 3D hip excursions with single leg touch1x10                PT  Education - 10/24/14 0955    Education provided Yes   Education Details Advanced HEP given    Person(s) Educated Patient   Methods Explanation;Demonstration;Handout   Comprehension Verbalized understanding;Returned demonstration          PT Short Term Goals - 10/24/14 0905    PT SHORT TERM GOAL #1   Title Patient will demonstrate the ability to perform functional dynamic gait and sports activities for at least one hour with pain no more than 2/10 bilateral knees   Status On-going   PT SHORT TERM GOAL #2   Title Patient will demonstrate an increase of at least one muscle grade in hip abductors and extensors in order to improve overall hip stability and reduce knee pain   Status On-going   PT SHORT TERM GOAL #3   Title Patient will demonstrate the ability to ascend/descend full flight of stairs with no railings,  good mechanics, pain no more than 2/10 bilateral knees   PT SHORT TERM GOAL #4   Title Patient will consistently and correctly perform HEP in order to assist in reducing impairments and reducing  bilateral knee pain           PT Long Term Goals - 10/24/14 0906    PT LONG TERM GOAL #1   Title Patient will demonstrate the ability to perform dynamic gait and sports activities for at least 4 hours with bilateral knee pain no more than 1/10   PT LONG TERM GOAL #2   Title Patient will demonstrate ability to perform full depth squat with bilateral knee pain no more than 1/10 in order to facilitate participation in dynamic sports tasks   PT LONG TERM GOAL #3   Title Patient will demonstrate the ability to correctly perform golfers squat as well as golf swings with good form and at least 10 times with bilateral knee pain no more than 1/10 to facilitate participation in sports   PT LONG TERM GOAL #4   Title Patient will demonstrate ability to correctly and consistently perform advanced HEP to maintain gains achieved in skilled PT and reduce risk of recurrence of pain               Plan - 10/24/14 0945    Clinical Impression Statement Progressed to all standing exercises following reports of compliance with HEP mat activites.  Lower height wiht forward and began side lunges for increased loading.  Progressed gluteal strengthening with single leg toe touch squats with good depth demonstrated and cueing to widen BOS to assist with balance.  Began golfers squat for core strengthenign following reports of return to golf next week.  Added side stepping with red tband resistance for glut med strengthening to improve stability.  Pt very interested in new exercises and requested a HEP printout for next actvities complete this session.  No reports of pain through session.     PT Next Visit Plan functional stretches and strengthening with focus on hip strength; dynamic balance; modalities if needed         Problem List Patient Active Problem List   Diagnosis Date Noted  . Chest pain 09/28/2011   Becky Saxasey Cockerham, LPTA [phone]  Juel BurrowCockerham, Casey Jo 10/24/2014, 9:56 AM  [place] [facility] [address] [place], [place], [address] Phone: [phone]   Fax:  [phone]

## 2014-10-30 ENCOUNTER — Ambulatory Visit (HOSPITAL_COMMUNITY): Payer: Medicare Other

## 2014-10-30 DIAGNOSIS — R29898 Other symptoms and signs involving the musculoskeletal system: Secondary | ICD-10-CM

## 2014-10-30 DIAGNOSIS — M25562 Pain in left knee: Principal | ICD-10-CM

## 2014-10-30 DIAGNOSIS — R269 Unspecified abnormalities of gait and mobility: Secondary | ICD-10-CM

## 2014-10-30 DIAGNOSIS — M25561 Pain in right knee: Secondary | ICD-10-CM

## 2014-10-30 NOTE — Therapy (Signed)
[facility] [address] [place], [place], [address] Phone: [phone]   Fax:  [phone]  Physical Therapy Treatment  Patient Details  Name: Amanda Vang MRN: [id number] Date of Birth: 09/29/1950 Referring Provider:  Catalina Pizza, MD  Encounter Date: 10/30/2014      PT End of Session - 10/30/14 1012    Visit Number 4   Number of Visits 8   Date for PT Re-Evaluation 10/30/14   Authorization Type UMR/UHC PPO   Authorization Time Period 10/09/14 to 12/09/14   Authorization - Visit Number 4   PT Start Time 0930   PT Stop Time 1012   PT Time Calculation (min) 42 min   Activity Tolerance Patient tolerated treatment well   Behavior During Therapy [facility] for tasks assessed/performed      Past Medical History  Diagnosis Date  . No pertinent past medical history     Past Surgical History  Procedure Laterality Date  . Colonoscopy  12/25/10    normal  . Tubal ligation    . Cataract extraction w/phaco  04/05/2011    Procedure: CATARACT EXTRACTION PHACO AND INTRAOCULAR LENS PLACEMENT (IOC);  Surgeon: Gemma Payor;  Location: [facility];  Service: Ophthalmology;  Laterality: Left;  CDE 11.84  . Cataract extraction w/phaco  09/04/2012    Procedure: CATARACT EXTRACTION PHACO AND INTRAOCULAR LENS PLACEMENT (IOC);  Surgeon: Gemma Payor, MD;  Location: [facility];  Service: Ophthalmology;  Laterality: Right;  CDE=14.70    There were no vitals filed for this visit.  Visit Diagnosis:  Knee pain, bilateral  Weakness of both hips  Abnormality of gait      Subjective Assessment - 10/30/14 0933    Symptoms Pt reported increased pain with the squats last session.  Currently pain free.  Stated she played a good round of golf on Saturday, scored a 79   Currently in Pain? No/denies            [facility] PT Assessment - 10/30/14 0001    Assessment   Onset Date 04/23/14   Next MD Visit Pt to call MD if PT doesn't work; if PT does not work, patient has  appointment in 6 weeks                   [facility] Adult PT Treatment/Exercise - 10/30/14 0947    Exercises   Exercises Knee/Hip   Knee/Hip Exercises: Stretches   Active Hamstring Stretch 3 reps;30 seconds   Active Hamstring Stretch Limitations 14 inch box, 3 way   Quad Stretch 3 reps;30 seconds   Quad Stretch Limitations prone with rope   ITB Stretch 3 reps;30 seconds   ITB Stretch Limitations by 6in step   Piriformis Stretch 3 reps;30 seconds   Piriformis Stretch Limitations supine figure 4   Gastroc Stretch 3 reps;30 seconds   Gastroc Stretch Limitations slantboard, 3 way   Knee/Hip Exercises: Standing   Forward Lunges Both;10 reps   Forward Lunges Limitations 2in step   Side Lunges Both;10 reps   Side Lunges Limitations 2in step   Lateral Step Up Both;10 reps;Hand Hold: 1;Step Height: 4"   Forward Step Up Both;10 reps;Hand Hold: 1;Step Height: 6"   Functional Squat 10 reps;5 reps   Functional Squat Limitations 10 STS; 5 functional   SLS with Vectors 3x 5" on Airex   Other Standing Knee Exercises Side stepping with greentband  PT Short Term Goals - 10/30/14 1026    PT SHORT TERM GOAL #1   Title Patient will demonstrate the ability to perform functional dynamic gait and sports activities for at least one hour with pain no more than 2/10 bilateral knees   Status On-going   PT SHORT TERM GOAL #2   Title Patient will demonstrate an increase of at least one muscle grade in hip abductors and extensors in order to improve overall hip stability and reduce knee pain   Status On-going   PT SHORT TERM GOAL #3   Title Patient will demonstrate the ability to ascend/descend full flight of stairs with no railings, good mechanics, pain no more than 2/10 bilateral knees   Status On-going   PT SHORT TERM GOAL #4   Title Patient will consistently and correctly perform HEP in order to assist in reducing impairments and reducing  bilateral knee pain   Status  On-going           PT Long Term Goals - 10/30/14 1027    PT LONG TERM GOAL #1   Title Patient will demonstrate the ability to perform dynamic gait and sports activities for at least 4 hours with bilateral knee pain no more than 1/10   PT LONG TERM GOAL #2   Title Patient will demonstrate ability to perform full depth squat with bilateral knee pain no more than 1/10 in order to facilitate participation in dynamic sports tasks   PT LONG TERM GOAL #3   Title Patient will demonstrate the ability to correctly perform golfers squat as well as golf swings with good form and at least 10 times with bilateral knee pain no more than 1/10 to facilitate participation in sports   PT LONG TERM GOAL #4   Title Patient will demonstrate ability to correctly and consistently perform advanced HEP to maintain gains achieved in skilled PT and reduce risk of recurrence of pain               Plan - 10/30/14 1014    Clinical Impression Statement Pt progressing well with functional strengthening, began stair training. without difficulty  Reduced reps with squats this session and educated pt on appropriate weight bearing for proper form  Pt able to complete sit to stands without difficutly. Began vector stance on dynamic surface to improve hip stability and advanced balance.     PT Next Visit Plan functional stretches and strengthening with focus on hip strength; dynamic balance; modalities if needed.  Begin VMO strengthening next session ball between knees wtih bridges and wall squats.        Problem List Patient Active Problem List   Diagnosis Date Noted  . Chest pain 09/28/2011   Becky Saxasey Cockerham, LPTA [phone]  Juel BurrowCockerham, Casey Jo 10/30/2014, 10:29 AM  [facility] [address] [place], [place], [address] Phone: [phone]   Fax:  [phone]

## 2014-11-01 ENCOUNTER — Ambulatory Visit (HOSPITAL_COMMUNITY): Payer: Medicare Other | Admitting: Physical Therapy

## 2014-11-04 ENCOUNTER — Ambulatory Visit (HOSPITAL_COMMUNITY): Payer: Medicare Other | Admitting: Physical Therapy

## 2014-11-04 DIAGNOSIS — R269 Unspecified abnormalities of gait and mobility: Secondary | ICD-10-CM

## 2014-11-04 DIAGNOSIS — R29898 Other symptoms and signs involving the musculoskeletal system: Secondary | ICD-10-CM

## 2014-11-04 DIAGNOSIS — M25562 Pain in left knee: Secondary | ICD-10-CM | POA: Diagnosis not present

## 2014-11-04 DIAGNOSIS — M25561 Pain in right knee: Secondary | ICD-10-CM

## 2014-11-04 NOTE — Therapy (Signed)
[place], [place], [address] Phone: [phone]   Fax:  [phone]  Physical Therapy Treatment  Patient Details  Name: Amanda Vang MRN: [id number] Date of Birth: [birth year] Referring Provider:  Delphina Cahill, MD  Encounter Date: 11/04/2014      PT End of Session - 11/04/14 0935    Visit Number 5   Number of Visits 8   Date for PT Re-Evaluation 11/28/14   Authorization Type UMR/UHC PPO   Authorization Time Period 10/09/14 to 12/09/14   Authorization - Visit Number 5   Activity Tolerance Patient tolerated treatment well   Behavior During Therapy [facility] for tasks assessed/performed      Past Medical History  Diagnosis Date  . No pertinent past medical history     Past Surgical History  Procedure Laterality Date  . Colonoscopy  12/25/10    normal  . Tubal ligation    . Cataract extraction w/phaco  04/05/2011    Procedure: CATARACT EXTRACTION PHACO AND INTRAOCULAR LENS PLACEMENT (IOC);  Surgeon: Tonny Branch;  Location: [facility];  Service: Ophthalmology;  Laterality: Left;  CDE 11.84  . Cataract extraction w/phaco  09/04/2012    Procedure: CATARACT EXTRACTION PHACO AND INTRAOCULAR LENS PLACEMENT (IOC);  Surgeon: Tonny Branch, MD;  Location: [facility];  Service: Ophthalmology;  Laterality: Right;  CDE=14.70    There were no vitals filed for this visit.  Visit Diagnosis:  Knee pain, bilateral  Weakness of both hips  Abnormality of gait      Subjective Assessment - 11/04/14 0933    Symptoms Diong well, still continues to have pain with squatting   Pertinent History patient plays a lot of golf; hurt left knee getting out of golf cart and twisted knee. No meniscus or other cartilage tears. About three weeks later, the right knee began hurting and edema and pain began showing up in both knees.    Patient Stated Goals get rid of pain, get back to normal activities    Currently in Pain? No/denies            [facility]  PT Assessment - 11/04/14 0001    AROM   Right Hip External Rotation  40   Right Hip Internal Rotation  34   Left Hip External Rotation  40   Left Hip Internal Rotation  38   Right Ankle Dorsiflexion 16   Left Ankle Dorsiflexion 17   Strength   Right Hip Flexion 5/5   Right Hip Extension 3+/5   Right Hip ABduction 4/5   Left Hip Flexion 5/5   Left Hip Extension 3+/5   Left Hip ABduction 4+/5   Right Knee Flexion 5/5   Right Knee Extension 5/5   Left Knee Flexion 5/5   Left Knee Extension 5/5   Right Ankle Dorsiflexion 5/5   Left Ankle Dorsiflexion 5/5   Flexibility   Quadriceps Continued tightness in bilateral quads and IT band                   [facility] Adult PT Treatment/Exercise - 11/04/14 0001    Knee/Hip Exercises: Stretches   Sports administrator 3 reps;30 seconds   Quad Stretch Limitations prone with rope   ITB Stretch 2 reps;30 seconds   ITB Stretch Limitations sidelying   Piriformis Stretch 2 reps;30 seconds   Piriformis Stretch Limitations seated   Knee/Hip Exercises: Standing   Forward Lunges Both;1 set;10 reps   Forward Lunges Limitations 2 inch step  Functional Squat 1 set;10 reps   Functional Squat Limitations with ball for VMO activation   Knee/Hip Exercises: Supine   Bridges AROM;Both;1 set;10 reps   Bridges Limitations with ball for VMO activation   Straight Leg Raises AROM;Both;1 set;10 reps   Straight Leg Raises Limitations with toes out for VMO activation   Knee/Hip Exercises: Prone   Hip Extension Both;1 set;10 reps   Hip Extension Limitations cues for proper form             Balance Exercises - 11/04/14 0923    Balance Exercises: Standing   SLS with Vectors Foam/compliant surface;5 reps  Star reaches on foam 1x5 each side           PT Education - 11/04/14 0934    Education provided Yes   Education Details Updated HEP; added quad stretches, ITB stretches, piriformis stretch, VMO bridges with ball, lunges onto elevated surface.  Advised patient to use ice and distal to proximal massage to assist in getting rid of edema that remains in medial knees   Person(s) Educated Patient   Methods Explanation;Demonstration;Handout   Comprehension Verbalized understanding;Returned demonstration          PT Short Term Goals - 11/04/14 0937    PT SHORT TERM GOAL #1   Title Patient will demonstrate the ability to perform functional dynamic gait and sports activities for at least one hour with pain no more than 2/10 bilateral knees   Time 2   Period Weeks   Status Achieved   PT SHORT TERM GOAL #2   Title Patient will demonstrate an increase of at least one muscle grade in hip abductors and extensors in order to improve overall hip stability and reduce knee pain   Baseline Extensors continue to be 3+/5    Time 2   Period Weeks   Status Partially Met   PT SHORT TERM GOAL #3   Title Patient will demonstrate the ability to ascend/descend full flight of stairs with no railings, good mechanics, pain no more than 2/10 bilateral knees   Time 2   Period Weeks   Status Achieved   PT SHORT TERM GOAL #4   Title Patient will consistently and correctly perform HEP in order to assist in reducing impairments and reducing  bilateral knee pain   Baseline Patient states that she is doing well and usually asks for updates to program   Time 2   Period Weeks   Status Achieved           PT Long Term Goals - 11/04/14 1610    PT LONG TERM GOAL #1   Title Patient will demonstrate the ability to perform dynamic gait and sports activities for at least 4 hours with bilateral knee pain no more than 1/10   Time 4   Period Weeks   Status On-going   PT LONG TERM GOAL #2   Title Patient will demonstrate ability to perform full depth squat with bilateral knee pain no more than 1/10 in order to facilitate participation in dynamic sports tasks   Baseline Continues to have pain with squatting   Time 4   Period Weeks   Status On-going   PT LONG  TERM GOAL #3   Title Patient will demonstrate the ability to correctly perform golfers squat as well as golf swings with good form and at least 10 times with bilateral knee pain no more than 1/10 to facilitate participation in sports   Baseline Continues to have pain with  squatting   Time 4   Period Weeks   Status On-going   PT LONG TERM GOAL #4   Title Patient will demonstrate ability to correctly and consistently perform advanced HEP to maintain gains achieved in skilled PT and reduce risk of recurrence of pain   Time 4   Period Weeks   Status On-going               Plan - 11/04/14 0935    Clinical Impression Statement Re-assessment performed today. Patient shows good progress towards goals but does continue to demonstrate tightness in bilateral ankles/hips/quads/and IT bands, weakness in bilateral hip extensors and bilateral VMO muscle groups, edema medial side of bilateral knees, pain with squatting, and reduced gait tolerance at this time. While patient has been able to increase her activity and return to sports activiites, patient will benefit from continued skilled PT services to address thesee impairments and assist her in reaching optimal level of function.   Pt will benefit from skilled therapeutic intervention in order to improve on the following deficits Abnormal gait;Decreased activity tolerance;Decreased strength;Impaired flexibility;Decreased balance;Decreased mobility;Decreased range of motion;Pain;Increased edema;Decreased coordination   Rehab Potential Good   PT Frequency Other (comment)  2 more sessions with more if needed; assess  pattient interest in continuing in 2 sessions   PT Duration Other (comment)  2 more sessions with more if needed; assess  pattient interest in continuing in 2 sessions   PT Treatment/Interventions ADLs/Self Care Home Management;Gait training;Neuromuscular re-education;Ultrasound;Stair training;Functional mobility training;Patient/family  education;Therapeutic activities;Cryotherapy;Electrical Stimulation;Therapeutic exercise;Manual techniques;Balance training   PT Next Visit Plan functional stretches and strengthening with focus on hip strength; dynamic balance; modalities if needed.  Begin VMO strengthening next session ball between knees wtih bridges and wall squats. Assess patient's interest in continuing with skilled PT.    PT Home Exercise Plan given   Consulted and Agree with Plan of Care Patient        Problem List Patient Active Problem List   Diagnosis Date Noted  . Chest pain 09/28/2011    Deniece Ree PT, DPT [place] [address] [place], [place], [address] Phone: [phone]   Fax:  [phone]

## 2014-11-04 NOTE — Patient Instructions (Addendum)
Quadriceps (Prone)   On stomach with sheet around ankles, knees together, hips down, pull heels toward bottom. Keep hips flat. Hold __30__ seconds. Repeat _2___ times. Do __2__ sessions per day. CAUTION: Stretch should be gentle, steady and slow.  Copyright  VHI. All rights reserved.   IT Band: Wall Lean With Crossed Leg   Stand with left hand on wall. Cross right leg behind other leg. Stretch hip toward wall with other arm supporting trunk. Hold _30__ seconds. Relax. Repeat _2__ times. Do _2__ times a day. Repeat on other side.  Copyright  VHI. All rights reserved.   Piriformis Stretch, Sitting   Sit, back straight, one leg straight, other leg bent, ankle on opposite thigh. Grasp knee and ankle of crossed leg. Pull leg toward trunk. Feel stretch in gluteals. Hold _30__ seconds.  Repeat _2__ times per session. Do _2__ sessions per day.  Copyright  VHI. All rights reserved.   Bridge   Place ball in between knees and hold it there during bridges. Lie back, legs bent. Inhale, pressing hips up. Keeping ribs in, lengthen lower back. Exhale, rolling down along spine from top. Repeat _10-15___ times. Do _2___ sessions per day.  Copyright  VHI. All rights reserved.   Quadriceps (Straight Leg Raises)   Lie flat with __0__ pound weight around left ankle. Keeping knee straight, turn toes out so they are facing the wall, lift leg approximately 10 inches off of table.  May do exercise sitting with back against wall. Keep back straight. Hold __2__ seconds. Repeat __10-15__ times. Do __2__ sessions per day. CAUTION: Move slowly, avoid jerking.  Copyright  VHI. All rights reserved. Extension   Lift leg up in the air and bring it back down. Use __0__ lbs on ankle. Repeat with other leg. Repeat __10-15__ times. Do __2__ sessions per day.  http://gt2.exer.us/388   Copyright  VHI. All rights reserved.    Body-Weight Forward Lunge: Stable - Stationary (Active)   Do on a  stair or onto a box to avoid having knee pain.Stand in wide stride, legs shoulder width apart, head up, back flat. Bend both legs simultaneously until forward thigh is parallel to floor. Complete _1__ sets of _10__ repetitions. Perform __2_ sessions per day. Do forward and to the side.   Copyright  VHI. All rights reserved.

## 2014-11-06 ENCOUNTER — Ambulatory Visit (HOSPITAL_COMMUNITY): Payer: Medicare Other | Admitting: Physical Therapy

## 2014-11-06 DIAGNOSIS — R269 Unspecified abnormalities of gait and mobility: Secondary | ICD-10-CM

## 2014-11-06 DIAGNOSIS — M25561 Pain in right knee: Secondary | ICD-10-CM

## 2014-11-06 DIAGNOSIS — R29898 Other symptoms and signs involving the musculoskeletal system: Secondary | ICD-10-CM

## 2014-11-06 DIAGNOSIS — M25562 Pain in left knee: Principal | ICD-10-CM

## 2014-11-06 NOTE — Therapy (Signed)
[place], [place], [address] Phone: [phone]   Fax:  [phone]  Physical Therapy Treatment  Patient Details  Name: Amanda Vang MRN: [id number] Date of Birth: Dec 14, 1950 Referring Provider:  Delphina Cahill, MD  Encounter Date: 11/06/2014      PT End of Session - 11/06/14 1017    Visit Number 6   Number of Visits 8   Date for PT Re-Evaluation 11/28/14   Authorization Type UMR/UHC PPO   Authorization Time Period 10/09/14 to 12/09/14   Authorization - Visit Number 6   PT Start Time 0930   PT Stop Time 1012   PT Time Calculation (min) 42 min   Activity Tolerance Patient tolerated treatment well   Behavior During Therapy [facility] for tasks assessed/performed      Past Medical History  Diagnosis Date  . No pertinent past medical history     Past Surgical History  Procedure Laterality Date  . Colonoscopy  12/25/10    normal  . Tubal ligation    . Cataract extraction w/phaco  04/05/2011    Procedure: CATARACT EXTRACTION PHACO AND INTRAOCULAR LENS PLACEMENT (IOC);  Surgeon: Tonny Branch;  Location: [facility];  Service: Ophthalmology;  Laterality: Left;  CDE 11.84  . Cataract extraction w/phaco  09/04/2012    Procedure: CATARACT EXTRACTION PHACO AND INTRAOCULAR LENS PLACEMENT (IOC);  Surgeon: Tonny Branch, MD;  Location: [facility];  Service: Ophthalmology;  Laterality: Right;  CDE=14.70    There were no vitals filed for this visit.  Visit Diagnosis:  Knee pain, bilateral  Weakness of both hips  Abnormality of gait      Subjective Assessment - 11/06/14 0932    Symptoms Patient doing well today, states that her family may finally be over all of the colds and bugs going around her family are almost gone   Pertinent History patient plays a lot of golf; hurt left knee getting out of golf cart and twisted knee. No meniscus or other cartilage tears. About three weeks later, the right knee began hurting and edema and pain  began showing up in both knees.    Currently in Pain? No/denies                       [facility] Adult PT Treatment/Exercise - 11/06/14 0001    Knee/Hip Exercises: Stretches   Active Hamstring Stretch 3 reps;30 seconds   Active Hamstring Stretch Limitations 14 inch box, 3 way   Quad Stretch 3 reps;30 seconds   Quad Stretch Limitations prone with rope   ITB Stretch 2 reps;30 seconds   ITB Stretch Limitations standing    Piriformis Stretch 2 reps;30 seconds   Piriformis Stretch Limitations seated   Gastroc Stretch 3 reps;30 seconds   Gastroc Stretch Limitations slantboard, 3 way   Knee/Hip Exercises: Standing   Heel Raises 1 set;15 reps   Heel Raises Limitations slantboard   Forward Lunges Both;1 set;10 reps   Forward Lunges Limitations to floor   Side Lunges Both;1 set;10 reps   Side Lunges Limitations to floor   Functional Squat 1 set;15 reps   Functional Squat Limitations with ball for VMO activation; squats down to 14 inch box 1x15 without knee pain   Other Standing Knee Exercises 3D hip excursions with single leg touch 1x10   Other Standing Knee Exercises Hip IR box walks 1x10 each side; sit to stand with toes out  1x10; side stepping 2x34f   Knee/Hip Exercises:  Supine   Bridges Both;1 set;15 reps   Bridges Limitations with ball for VMO activation   Straight Leg Raises Both;1 set;15 reps   Straight Leg Raises Limitations with toe out for VMO activation                 PT Education - 11/06/14 1017    Education provided Yes   Education Details Education regarding additions to [employer]) Educated Patient   Methods Explanation   Comprehension Verbalized understanding          PT Short Term Goals - 11/04/14 0937    PT SHORT TERM GOAL #1   Title Patient will demonstrate the ability to perform functional dynamic gait and sports activities for at least one hour with pain no more than 2/10 bilateral knees   Time 2   Period Weeks   Status Achieved    PT SHORT TERM GOAL #2   Title Patient will demonstrate an increase of at least one muscle grade in hip abductors and extensors in order to improve overall hip stability and reduce knee pain   Baseline Extensors continue to be 3+/5    Time 2   Period Weeks   Status Partially Met   PT SHORT TERM GOAL #3   Title Patient will demonstrate the ability to ascend/descend full flight of stairs with no railings, good mechanics, pain no more than 2/10 bilateral knees   Time 2   Period Weeks   Status Achieved   PT SHORT TERM GOAL #4   Title Patient will consistently and correctly perform HEP in order to assist in reducing impairments and reducing  bilateral knee pain   Baseline Patient states that she is doing well and usually asks for updates to program   Time 2   Period Weeks   Status Achieved           PT Long Term Goals - 11/04/14 7672    PT LONG TERM GOAL #1   Title Patient will demonstrate the ability to perform dynamic gait and sports activities for at least 4 hours with bilateral knee pain no more than 1/10   Time 4   Period Weeks   Status On-going   PT LONG TERM GOAL #2   Title Patient will demonstrate ability to perform full depth squat with bilateral knee pain no more than 1/10 in order to facilitate participation in dynamic sports tasks   Baseline Continues to have pain with squatting   Time 4   Period Weeks   Status On-going   PT LONG TERM GOAL #3   Title Patient will demonstrate the ability to correctly perform golfers squat as well as golf swings with good form and at least 10 times with bilateral knee pain no more than 1/10 to facilitate participation in sports   Baseline Continues to have pain with squatting   Time 4   Period Weeks   Status On-going   PT LONG TERM GOAL #4   Title Patient will demonstrate ability to correctly and consistently perform advanced HEP to maintain gains achieved in skilled PT and reduce risk of recurrence of pain   Time 4   Period Weeks    Status On-going               Plan - 11/06/14 1018    Clinical Impression Statement Progressed patient's exercise program by introducing lunges to floor and sit to stand with toes out. Patient tolerated today's session well today overall, however  states that her hips continue to feel tight despite Piriformis stretches and hip IR exercises. Patient able to perform squat to 14 inch box without pain today. Very motivated to perform exercises on her own at home and requests that next session be her last since she is feeling confident managing condition at home.    Pt will benefit from skilled therapeutic intervention in order to improve on the following deficits Abnormal gait;Decreased activity tolerance;Decreased strength;Impaired flexibility;Decreased balance;Decreased mobility;Decreased range of motion;Pain;Increased edema;Decreased coordination   Rehab Potential Good   PT Frequency Other (comment)  one more visit, then DC   PT Duration Other (comment)  one more visit, then DC   PT Treatment/Interventions ADLs/Self Care Home Management;Gait training;Neuromuscular re-education;Ultrasound;Stair training;Functional mobility training;Patient/family education;Therapeutic activities;Cryotherapy;Electrical Stimulation;Therapeutic exercise;Manual techniques;Balance training   PT Next Visit Plan Discharge next session (Wed April 6th).Functional stretches and strengthening with focus on hip strength; dynamic balance; modalities if needed.  Begin VMO strengthening next session ball between knees wtih bridges and wall squats. Assess patient's interest in continuing with skilled PT.    PT Home Exercise Plan given   Consulted and Agree with Plan of Care Patient        Problem List Patient Active Problem List   Diagnosis Date Noted  . Chest pain 09/28/2011   Deniece Ree PT, DPT [place] [address] [place], [place],  [address] Phone: [phone]   Fax:  [phone]

## 2014-11-13 ENCOUNTER — Ambulatory Visit (HOSPITAL_COMMUNITY): Payer: Medicare Other | Admitting: Physical Therapy

## 2014-11-14 ENCOUNTER — Telehealth (HOSPITAL_COMMUNITY): Payer: Self-pay | Admitting: Physical Therapy

## 2014-11-14 ENCOUNTER — Ambulatory Visit (HOSPITAL_COMMUNITY): Payer: Medicare Other | Admitting: Physical Therapy

## 2014-11-14 NOTE — Telephone Encounter (Signed)
Patient has progressed well with skilled PT services, but has cancelled most recent appointments. Called mobile and left message instructing patient that if she is interested in participating in one follow up PT session before discharge  to please give clinic a call back.  Nedra HaiKristen Unger PT, DPT [phone]

## 2014-11-26 ENCOUNTER — Ambulatory Visit (HOSPITAL_COMMUNITY): Payer: Medicare Other | Attending: Orthopedic Surgery | Admitting: Physical Therapy

## 2014-11-26 DIAGNOSIS — M25561 Pain in right knee: Secondary | ICD-10-CM

## 2014-11-26 DIAGNOSIS — R269 Unspecified abnormalities of gait and mobility: Secondary | ICD-10-CM | POA: Diagnosis not present

## 2014-11-26 DIAGNOSIS — R29898 Other symptoms and signs involving the musculoskeletal system: Secondary | ICD-10-CM

## 2014-11-26 DIAGNOSIS — M25562 Pain in left knee: Secondary | ICD-10-CM | POA: Insufficient documentation

## 2014-11-26 DIAGNOSIS — M6281 Muscle weakness (generalized): Secondary | ICD-10-CM | POA: Diagnosis not present

## 2014-11-26 NOTE — Therapy (Signed)
[place], [place], [address] Phone: [phone]   Fax:  [phone]  Physical Therapy Treatment  Patient Details  Name: Amanda Vang MRN: [id number] Date of Birth: 19-Jun-1951 Referring Provider:  Rod Can, MD  Encounter Date: 11/26/2014      PT End of Session - 11/26/14 0850    Visit Number 7   Number of Visits 8   Authorization Type [employer]   Authorization Time Period 10/09/14 to 12/09/14   Authorization - Visit Number 7   PT Start Time 0801   PT Stop Time 1443   PT Time Calculation (min) 43 min   Activity Tolerance Patient tolerated treatment well   Behavior During Therapy [facility] for tasks assessed/performed      Past Medical History  Diagnosis Date  . No pertinent past medical history     Past Surgical History  Procedure Laterality Date  . Colonoscopy  12/25/10    normal  . Tubal ligation    . Cataract extraction w/phaco  04/05/2011    Procedure: CATARACT EXTRACTION PHACO AND INTRAOCULAR LENS PLACEMENT (IOC);  Surgeon: Tonny Branch;  Location: [facility];  Service: Ophthalmology;  Laterality: Left;  CDE 11.84  . Cataract extraction w/phaco  09/04/2012    Procedure: CATARACT EXTRACTION PHACO AND INTRAOCULAR LENS PLACEMENT (IOC);  Surgeon: Tonny Branch, MD;  Location: [facility];  Service: Ophthalmology;  Laterality: Right;  CDE=14.70    There were no vitals filed for this visit.  Visit Diagnosis:  Knee pain, bilateral  Weakness of both hips  Abnormality of gait      Subjective Assessment - 11/26/14 0802    Subjective Patient doing very well today, states she had a very busy weekend and was very active; states that HEP is going well and that she has been able to participate in golf without any problems   Pertinent History patient plays a lot of golf; hurt left knee getting out of golf cart and twisted knee. No meniscus or other cartilage tears. About three weeks later, the right knee began hurting  and edema and pain began showing up in both knees.    How long can you walk comfortably? 4/19- is able to be on the go all day without significant knee pain, does feel tired and mildly sore at the end of the day though    Patient Stated Goals get rid of pain, get back to normal activities    Currently in Pain? No/denies            [facility] PT Assessment - 11/26/14 0001    AROM   Right Hip External Rotation  45   Right Hip Internal Rotation  38   Left Hip External Rotation  45   Left Hip Internal Rotation  39   Right Ankle Dorsiflexion 19   Left Ankle Dorsiflexion 18   Strength   Right Hip Flexion 5/5   Right Hip Extension 4-/5   Right Hip ABduction 4/5   Left Hip Flexion 5/5   Left Hip Extension 4/5   Left Hip ABduction 4+/5   Right Knee Flexion 5/5   Right Knee Extension 5/5   Left Knee Flexion 5/5   Left Knee Extension 5/5   Right Ankle Dorsiflexion 5/5   Left Ankle Dorsiflexion 5/5                     [facility] Adult PT Treatment/Exercise - 11/26/14 0001    Knee/Hip Exercises:  Stretches   Active Hamstring Stretch 3 reps;30 seconds   Active Hamstring Stretch Limitations 14 inch box, 3 way   Quad Stretch 3 reps;30 seconds   Quad Stretch Limitations prone with rope   Piriformis Stretch 3 reps;30 seconds   Piriformis Stretch Limitations seated    Gastroc Stretch 3 reps;30 seconds   Gastroc Stretch Limitations slantboard, 3-ways    Knee/Hip Exercises: Standing   Forward Lunges Both;1 set;10 reps   Forward Lunges Limitations 4 way, to floor    Forward Step Up Both;1 set;10 reps   Forward Step Up Limitations 12 inch box    Other Standing Knee Exercises 3D hip excursions on single leg 1x10   Other Standing Knee Exercises Side stepping along wall approximately 2x110f                 PT Education - 11/26/14 0850    Education provided Yes   Education Details Education regarding techniques to manage bilateral swelling in medial knees    Person(s) Educated  Patient   Methods Explanation   Comprehension Verbalized understanding          PT Short Term Goals - 11/26/14 0852    PT SHORT TERM GOAL #1   Title Patient will demonstrate the ability to perform functional dynamic gait and sports activities for at least one hour with pain no more than 2/10 bilateral knees   Time 2   Period Weeks   Status Achieved   PT SHORT TERM GOAL #2   Title Patient will demonstrate an increase of at least one muscle grade in hip abductors and extensors in order to improve overall hip stability and reduce knee pain   Baseline Extensors continue to be 3+/5    Time 2   Period Weeks   Status Partially Met   PT SHORT TERM GOAL #3   Title Patient will demonstrate the ability to ascend/descend full flight of stairs with no railings, good mechanics, pain no more than 2/10 bilateral knees   Time 2   Period Weeks   Status Achieved   PT SHORT TERM GOAL #4   Title Patient will consistently and correctly perform HEP in order to assist in reducing impairments and reducing  bilateral knee pain   Baseline Patient states that she is doing well and usually asks for updates to program   Time 2   Period Weeks   Status Achieved           PT Long Term Goals - 11/26/14 07510   PT LONG TERM GOAL #1   Title Patient will demonstrate the ability to perform dynamic gait and sports activities for at least 4 hours with bilateral knee pain no more than 1/10   Time 4   Period Weeks   Status Achieved   PT LONG TERM GOAL #2   Title Patient will demonstrate ability to perform full depth squat with bilateral knee pain no more than 1/10 in order to facilitate participation in dynamic sports tasks   Baseline Continues to have pain with squatting   Time 4   Period Weeks   Status Achieved   PT LONG TERM GOAL #3   Title Patient will demonstrate the ability to correctly perform golfers squat as well as golf swings with good form and at least 10 times with bilateral knee pain no more  than 1/10 to facilitate participation in sports   Time 4   Period Weeks   Status Achieved   PT LONG TERM  GOAL #4   Title Patient will demonstrate ability to correctly and consistently perform advanced HEP to maintain gains achieved in skilled PT and reduce risk of recurrence of pain   Time 4   Period Weeks   Status Achieved               Plan - 11/26/14 0851    Clinical Impression Statement Re-assessment performed today. Patient shows good strength in bilateral lower extremities and proximal musculature, good bilateral joint range of motion, is now able to participate in functional sports and leisure activities without pain, and has demonstrated the ability to perform full depth squat to the floor without pain or difficulty. Patient adheres well to the advanced HEP she was given and was advised to continue HEP with ankle weights. She does show some concern regarding the small bilateral pockets of edema still present on the medial side of both knees but is using appropriate techniqjues, including ice, exercise, and massage, to reduce the swelling; she plans to consult her MD regarding the swelling however from the PT perspective it does not appear to be associated with inflammation or injury of the structures of the knee. Patient has progressed well with skilled PT services and has demonstrated the ability to effectively manage her condition and continue her HEP at home. Discharge from skilled PT services today.   Pt will benefit from skilled therapeutic intervention in order to improve on the following deficits Abnormal gait;Decreased activity tolerance;Decreased strength;Impaired flexibility;Decreased balance;Decreased mobility;Decreased range of motion;Pain;Increased edema;Decreased coordination   Rehab Potential Good   PT Treatment/Interventions ADLs/Self Care Home Management;Gait training;Neuromuscular re-education;Ultrasound;Stair training;Functional mobility training;Patient/family  education;Therapeutic activities;Cryotherapy;Electrical Stimulation;Therapeutic exercise;Manual techniques;Balance training   PT Next Visit Plan N/A- discharge    PT Home Exercise Plan given   Consulted and Agree with Plan of Care Patient        Problem List Patient Active Problem List   Diagnosis Date Noted  . Chest pain 09/28/2011    PHYSICAL THERAPY DISCHARGE SUMMARY  Visits from Start of Care: 7  Current functional level related to goals / functional outcomes:  Mild edema medial bilateral knees; patient otherwise able to perform all functional tasks and exercises, able to participate in sporting events on unlimited basis without pain at this time. Very motivated to correctly and consistently perform HEP at home.    Remaining deficits: Medial edema bilateral knees despite edema management techniques    Education / Equipment: HEP Plan: Patient agrees to discharge.  Patient goals were met. Patient is being discharged due to meeting the stated rehab goals.  ?????        Deniece Ree PT, DPT [place] [address] [place], [place], [address] Phone: [phone]   Fax:  [phone]

## 2014-12-12 ENCOUNTER — Inpatient Hospital Stay (HOSPITAL_COMMUNITY): Payer: Medicare Other | Admitting: Anesthesiology

## 2014-12-12 ENCOUNTER — Encounter (HOSPITAL_COMMUNITY): Payer: Self-pay | Admitting: *Deleted

## 2014-12-12 ENCOUNTER — Other Ambulatory Visit: Payer: Self-pay | Admitting: Ophthalmology

## 2014-12-12 ENCOUNTER — Encounter (HOSPITAL_COMMUNITY): Admission: RE | Disposition: A | Discharge status: Home / Self Care | Payer: Self-pay | Attending: Ophthalmology

## 2014-12-12 ENCOUNTER — Ambulatory Visit (HOSPITAL_COMMUNITY)
Admission: RE | Admit: 2014-12-12 | Discharge: 2014-12-12 | Disposition: A | Discharge status: Home / Self Care | Payer: Medicare Other | Source: Other Acute Inpatient Hospital | Attending: Ophthalmology | Admitting: Ophthalmology

## 2014-12-12 ENCOUNTER — Other Ambulatory Visit (HOSPITAL_COMMUNITY): Payer: Self-pay | Admitting: Ophthalmology

## 2014-12-12 DIAGNOSIS — Z87891 Personal history of nicotine dependence: Secondary | ICD-10-CM | POA: Diagnosis not present

## 2014-12-12 DIAGNOSIS — H33022 Retinal detachment with multiple breaks, left eye: Secondary | ICD-10-CM | POA: Insufficient documentation

## 2014-12-12 DIAGNOSIS — Z9851 Tubal ligation status: Secondary | ICD-10-CM | POA: Diagnosis not present

## 2014-12-12 DIAGNOSIS — Z961 Presence of intraocular lens: Secondary | ICD-10-CM | POA: Insufficient documentation

## 2014-12-12 HISTORY — PX: GAS INSERTION: SHX5336

## 2014-12-12 HISTORY — DX: Other specified health status: Z78.9

## 2014-12-12 HISTORY — PX: PARS PLANA VITRECTOMY: SHX2166

## 2014-12-12 LAB — BASIC METABOLIC PANEL
ANION GAP: 8 (ref 5–15)
BUN: 14 mg/dL (ref 6–20)
CALCIUM: 9.4 mg/dL (ref 8.9–10.3)
CO2: 27 mmol/L (ref 22–32)
Chloride: 104 mmol/L (ref 101–111)
Creatinine, Ser: 0.85 mg/dL (ref 0.44–1.00)
GFR calc Af Amer: >60 mL/min (ref >60)
Glucose, Bld: 98 mg/dL (ref 70–99)
Potassium: 3.8 mmol/L (ref 3.5–5.1)
SODIUM: 139 mmol/L (ref 135–145)

## 2014-12-12 LAB — CBC
HEMATOCRIT: 43.2 % (ref 36.0–46.0)
Hemoglobin: 14.1 g/dL (ref 12.0–15.0)
MCH: 27.7 pg (ref 26.0–34.0)
MCHC: 32.6 g/dL (ref 30.0–36.0)
MCV: 84.9 fL (ref 78.0–100.0)
Platelets: 207 10*3/uL (ref 150–400)
RBC: 5.09 MIL/uL (ref 3.87–5.11)
RDW: 12.6 % (ref 11.5–15.5)
WBC: 6.4 10*3/uL (ref 4.0–10.5)

## 2014-12-12 SURGERY — PARS PLANA VITRECTOMY WITH 25 GAUGE
Anesthesia: Monitor Anesthesia Care | Laterality: Left

## 2014-12-12 SURGERY — PARS PLANA VITRECTOMY WITH 25 GAUGE
Anesthesia: General | Laterality: Left

## 2014-12-12 MED ORDER — PHENYLEPHRINE HCL 2.5 % OP SOLN
OPHTHALMIC | Status: AC
  Administered 2014-12-12: 1 [drp] via OPHTHALMIC
  Filled 2014-12-12: qty 2

## 2014-12-12 MED ORDER — LIDOCAINE HCL (CARDIAC) 20 MG/ML IV SOLN
INTRAVENOUS | Status: AC
  Filled 2014-12-12: qty 5

## 2014-12-12 MED ORDER — SODIUM CHLORIDE 0.9 % IV SOLN
INTRAVENOUS | Status: DC
  Administered 2014-12-12 (×2): via INTRAVENOUS

## 2014-12-12 MED ORDER — GATIFLOXACIN 0.5 % OP SOLN
1.0000 [drp] | OPHTHALMIC | Status: AC | PRN
  Administered 2014-12-12 (×3): 1 [drp] via OPHTHALMIC

## 2014-12-12 MED ORDER — HYPROMELLOSE (GONIOSCOPIC) 2.5 % OP SOLN
OPHTHALMIC | Status: DC | PRN
  Administered 2014-12-12: 2 [drp] via OPHTHALMIC

## 2014-12-12 MED ORDER — CYCLOPENTOLATE HCL 1 % OP SOLN
1.0000 [drp] | OPHTHALMIC | Status: DC | PRN
  Administered 2014-12-12 (×2): 1 [drp] via OPHTHALMIC

## 2014-12-12 MED ORDER — CYCLOPENTOLATE HCL 1 % OP SOLN
OPHTHALMIC | Status: AC
  Administered 2014-12-12: 1 [drp] via OPHTHALMIC
  Filled 2014-12-12: qty 2

## 2014-12-12 MED ORDER — PHENYLEPHRINE HCL 2.5 % OP SOLN: 1.0000 [drp] | OPHTHALMIC | Status: DC | PRN

## 2014-12-12 MED ORDER — KETOROLAC TROMETHAMINE 30 MG/ML IJ SOLN
INTRAMUSCULAR | Status: DC | PRN
  Administered 2014-12-12: 30 mg via INTRAVENOUS

## 2014-12-12 MED ORDER — KETOROLAC TROMETHAMINE 30 MG/ML IJ SOLN
INTRAMUSCULAR | Status: AC
  Filled 2014-12-12: qty 1

## 2014-12-12 MED ORDER — TETRACAINE HCL 0.5 % OP SOLN
OPHTHALMIC | Status: AC
  Filled 2014-12-12: qty 2

## 2014-12-12 MED ORDER — LIDOCAINE HCL 2 % IJ SOLN
INTRAMUSCULAR | Status: DC | PRN
  Administered 2014-12-12: 10 mL

## 2014-12-12 MED ORDER — OXYCODONE HCL 5 MG/5ML PO SOLN: 5.0000 mg | Freq: Once | ORAL | Status: AC | PRN

## 2014-12-12 MED ORDER — PROPOFOL 10 MG/ML IV BOLUS
INTRAVENOUS | Status: DC | PRN
  Administered 2014-12-12: 50 mg via INTRAVENOUS
  Administered 2014-12-12: 20 mg via INTRAVENOUS

## 2014-12-12 MED ORDER — EPINEPHRINE HCL 1 MG/ML IJ SOLN
INTRAMUSCULAR | Status: DC | PRN
  Administered 2014-12-12: .3 mL

## 2014-12-12 MED ORDER — CYCLOPENTOLATE HCL 1 % OP SOLN: 1.0000 [drp] | OPHTHALMIC | Status: DC | PRN

## 2014-12-12 MED ORDER — OXYCODONE HCL 5 MG PO TABS
5.0000 mg | ORAL_TABLET | Freq: Once | ORAL | Status: AC | PRN
  Administered 2014-12-12: 5 mg via ORAL

## 2014-12-12 MED ORDER — HYPROMELLOSE (GONIOSCOPIC) 2.5 % OP SOLN
OPHTHALMIC | Status: AC
  Filled 2014-12-12: qty 15

## 2014-12-12 MED ORDER — PROPOFOL INFUSION 10 MG/ML OPTIME
INTRAVENOUS | Status: DC | PRN
  Administered 2014-12-12: 50 ug/kg/min via INTRAVENOUS

## 2014-12-12 MED ORDER — LIDOCAINE HCL (CARDIAC) 20 MG/ML IV SOLN
INTRAVENOUS | Status: DC | PRN
  Administered 2014-12-12: 60 mg via INTRAVENOUS

## 2014-12-12 MED ORDER — PHENYLEPHRINE HCL 2.5 % OP SOLN
1.0000 [drp] | OPHTHALMIC | Status: AC | PRN
  Administered 2014-12-12 (×3): 1 [drp] via OPHTHALMIC

## 2014-12-12 MED ORDER — EPINEPHRINE HCL 1 MG/ML IJ SOLN
INTRAMUSCULAR | Status: AC
  Filled 2014-12-12: qty 1

## 2014-12-12 MED ORDER — FENTANYL CITRATE (PF) 100 MCG/2ML IJ SOLN
25.0000 ug | INTRAMUSCULAR | Status: DC | PRN
  Administered 2014-12-12: 50 ug via INTRAVENOUS

## 2014-12-12 MED ORDER — DEXAMETHASONE SODIUM PHOSPHATE 10 MG/ML IJ SOLN
INTRAMUSCULAR | Status: AC
  Filled 2014-12-12: qty 1

## 2014-12-12 MED ORDER — GATIFLOXACIN 0.5 % OP SOLN
OPHTHALMIC | Status: AC
  Administered 2014-12-12: 1 [drp] via OPHTHALMIC
  Filled 2014-12-12: qty 2.5

## 2014-12-12 MED ORDER — BSS PLUS IO SOLN
INTRAOCULAR | Status: AC
  Filled 2014-12-12: qty 500

## 2014-12-12 MED ORDER — PROMETHAZINE HCL 25 MG/ML IJ SOLN: 6.2500 mg | INTRAMUSCULAR | Status: DC | PRN

## 2014-12-12 MED ORDER — GENTAMICIN SULFATE 40 MG/ML IJ SOLN
INTRAMUSCULAR | Status: AC
  Filled 2014-12-12: qty 2

## 2014-12-12 MED ORDER — FENTANYL CITRATE (PF) 100 MCG/2ML IJ SOLN
INTRAMUSCULAR | Status: AC
  Administered 2014-12-12: 50 ug via INTRAVENOUS
  Filled 2014-12-12: qty 2

## 2014-12-12 MED ORDER — BACITRACIN-POLYMYXIN B 500-10000 UNIT/GM OP OINT
TOPICAL_OINTMENT | OPHTHALMIC | Status: AC
  Filled 2014-12-12: qty 3.5

## 2014-12-12 MED ORDER — LIDOCAINE HCL 2 % IJ SOLN
INTRAMUSCULAR | Status: AC
  Filled 2014-12-12: qty 20

## 2014-12-12 MED ORDER — MEPERIDINE HCL 25 MG/ML IJ SOLN: 6.2500 mg | INTRAMUSCULAR | Status: DC | PRN

## 2014-12-12 MED ORDER — GATIFLOXACIN 0.5 % OP SOLN: 1.0000 [drp] | OPHTHALMIC | Status: DC | PRN

## 2014-12-12 MED ORDER — OXYCODONE HCL 5 MG PO TABS
ORAL_TABLET | ORAL | Status: AC
  Filled 2014-12-12: qty 1

## 2014-12-12 SURGICAL SUPPLY — 65 items
APPLICATOR COTTON TIP 6IN STRL (MISCELLANEOUS) ×2 IMPLANT
APPLICATOR DR MATTHEWS STRL (MISCELLANEOUS) IMPLANT
BLADE 10 SAFETY STRL DISP (BLADE) ×2 IMPLANT
BLADE MVR KNIFE 20G (BLADE) IMPLANT
CANNULA ANT CHAM MAIN (OPHTHALMIC RELATED) IMPLANT
CANNULA FLEX TIP 25G (CANNULA) IMPLANT
CANNULA VLV SOFT TIP 25GA (OPHTHALMIC) ×2 IMPLANT
CORDS BIPOLAR (ELECTRODE) IMPLANT
COVER MAYO STAND STRL (DRAPES) ×2 IMPLANT
DRAPE INCISE 51X51 W/FILM STRL (DRAPES) IMPLANT
DRAPE OPHTHALMIC 77X100 STRL (CUSTOM PROCEDURE TRAY) ×2 IMPLANT
FILTER BLUE MILLIPORE (MISCELLANEOUS) IMPLANT
FORCEPS ECKARDT ILM 25G SERR (OPHTHALMIC RELATED) IMPLANT
FORCEPS GRIESHABER ILM 25G A (INSTRUMENTS) IMPLANT
FORCEPS HORIZONTAL 25G DISP (OPHTHALMIC RELATED) IMPLANT
FORCEPS ILM 25G DSP TIP (MISCELLANEOUS) IMPLANT
GAS AUTO FILL CONSTEL (OPHTHALMIC)
GAS AUTO FILL CONSTELLATION (OPHTHALMIC) IMPLANT
GAS OPHTHALMIC (MISCELLANEOUS) ×2 IMPLANT
GLOVE SS BIOGEL STRL SZ 8.5 (GLOVE) ×1 IMPLANT
GLOVE SUPERSENSE BIOGEL SZ 8.5 (GLOVE) ×1
GOWN STRL REUS W/ TWL LRG LVL3 (GOWN DISPOSABLE) ×1 IMPLANT
GOWN STRL REUS W/ TWL XL LVL3 (GOWN DISPOSABLE) ×1 IMPLANT
GOWN STRL REUS W/TWL LRG LVL3 (GOWN DISPOSABLE) ×1
GOWN STRL REUS W/TWL XL LVL3 (GOWN DISPOSABLE) ×1
HANDLE PNEUMATIC FOR CONSTEL (OPHTHALMIC) IMPLANT
KIT BASIN OR (CUSTOM PROCEDURE TRAY) ×2 IMPLANT
KIT PERFLUORON PROCEDURE 5ML (MISCELLANEOUS) IMPLANT
KIT ROOM TURNOVER OR (KITS) ×2 IMPLANT
KNIFE CRESCENT 2.5 55 ANG (BLADE) IMPLANT
LENS BIOM SUPER VIEW SET DISP (OPHTHALMIC RELATED) ×2 IMPLANT
MARKER SKIN DUAL TIP RULER LAB (MISCELLANEOUS) IMPLANT
MICROPICK 25G (MISCELLANEOUS)
NEEDLE 18GX1X1/2 (RX/OR ONLY) (NEEDLE) IMPLANT
NEEDLE 25GX 5/8IN NON SAFETY (NEEDLE) IMPLANT
NEEDLE FILTER BLUNT 18X 1/2SAF (NEEDLE)
NEEDLE FILTER BLUNT 18X1 1/2 (NEEDLE) IMPLANT
NEEDLE HYPO 25GX1X1/2 BEV (NEEDLE) IMPLANT
NS IRRIG 1000ML POUR BTL (IV SOLUTION) ×2 IMPLANT
PACK FRAGMATOME (OPHTHALMIC) IMPLANT
PACK VITRECTOMY CUSTOM (CUSTOM PROCEDURE TRAY) ×2 IMPLANT
PAD ARMBOARD 7.5X6 YLW CONV (MISCELLANEOUS) ×4 IMPLANT
PAK PIK VITRECTOMY CVS 25GA (OPHTHALMIC) ×2 IMPLANT
PAK VITRECTOMY PIK 25 GA (OPHTHALMIC RELATED) IMPLANT
PENCIL BIPOLAR 25GA STR DISP (OPHTHALMIC RELATED) IMPLANT
PICK MICROPICK 25G (MISCELLANEOUS) IMPLANT
PROBE DIRECTIONAL LASER (MISCELLANEOUS) IMPLANT
PROBE LASER ILLUM FLEX CVD 25G (OPHTHALMIC) ×2 IMPLANT
ROLLS DENTAL (MISCELLANEOUS) IMPLANT
SCRAPER DIAMOND 25GA (OPHTHALMIC RELATED) IMPLANT
SET INJECTOR OIL FLUID CONSTEL (OPHTHALMIC) IMPLANT
STOCKINETTE IMPERVIOUS 9X36 MD (GAUZE/BANDAGES/DRESSINGS) ×4 IMPLANT
STOPCOCK 4 WAY LG BORE MALE ST (IV SETS) IMPLANT
SUT ETHILON 10 0 CS140 6 (SUTURE) IMPLANT
SUT ETHILON 8 0 BV130 4 (SUTURE) IMPLANT
SUT MERSILENE 5 0 RD 1 DA (SUTURE) IMPLANT
SUT PROLENE 10 0 CIF 4 DA (SUTURE) IMPLANT
SUT VICRYL 7 0 TG140 8 (SUTURE) IMPLANT
SYR 30ML SLIP (SYRINGE) IMPLANT
SYR 5ML LL (SYRINGE) IMPLANT
SYR TB 1ML LUER SLIP (SYRINGE) IMPLANT
TOWEL OR 17X24 6PK STRL BLUE (TOWEL DISPOSABLE) ×4 IMPLANT
TOWEL OR 17X26 10 PK STRL BLUE (TOWEL DISPOSABLE) ×2 IMPLANT
WATER STERILE IRR 1000ML POUR (IV SOLUTION) ×2 IMPLANT
WIPE INSTRUMENT VISIWIPE 73X73 (MISCELLANEOUS) ×2 IMPLANT

## 2014-12-12 NOTE — Op Note (Addendum)
Preoperative diagnosis  #1 rhegmatogenous retinal detachment, left eye, pseudophakic, macula on  #2 multiple retinal breaks  Postoperative diagnosis the same  Procedure #1: Repair of complex retinal detachment left eye via vitrectomy, membrane peel, endolaser retinopexy, injection of intravitreal gas-C3F8 8% left eye  Surgeon: Edmon CrapeGary a Brittiny Levitz M.D.  Anesthesia: Local retrobulbar with monitored anesthesia control, Xylocaine 2%  Counts were correct  Drains none  No blood loss  EScription of procedure in detail: Appropriate signed consent was obtained patient taken to the operating room where appropriate monitors, mild sedation. Appropriate site selection from the surgical timeout the OR staff and the surgeon identified left eye is a operative side thereafter not under mild sedation 2% Xylocaine 5 mL delivered retrobulbar with additional 5 mL laterally in fashion of modified Darel HongVan Lint. Operative Region sterilely Prepped and draped usual fashion.  Speculum applied to the left eye. Artery to the prep and drape.  After the lid speculum applied, 25-gauge trocar placed in the inferotemporal quadrant and the placement verified visually. The fusion connected and turned on. The superior trochars were placed. Surgical instruments wereintroduced. Vicryl Scope was used with the biome BIOM attachment applied. Core vitrectomy was then begun. Vitreous shaving tenace and then used to trim the peripheral vitreous base. The vitreous base circumcised. Vitreous attachments to the flap tears in the superonasal quadrant were amputated. The mobilization of peripheral retina wasn't was completed. Dr. retinal detachment in the in this left eye from the 9 to 12:00 position extending to the optic nerve posteriorly. Wall amount of thick subretinal fluid was aspirated using solution needle through the peripheral retinal break. Posterior retinotomy was fashioned to drain all subretinal fluid. A fluid fluid exchange completed.  Thereafter introduction of air into the vitreous cavity began. Her air-fluid exchange, retina reattached nicely. Follow retinal breaks and peripheral retinal detachment were treated with endolaser retinopexy superonasal quadrant. As a photo coagulation scars applied 360 to straddle the vitreous base. Preretinal and subretinal fluid were reaspirated on several occasions. Retinotomy flatten nicely. Endolaser retinopexy applied around the retinotomy.  At this time an air C3F8 8% exchange was completed. Superior trochars are removed. Infusions were  removed. Subconjunctival Decadron applied. Patch and Fox shield applied. Patient tolerated procedure well without complication hemodynamically stable taken to the PACU. Sterile patch and Fox shield have been applied.

## 2014-12-12 NOTE — Anesthesia Postprocedure Evaluation (Signed)
  Anesthesia Post-op Note  Patient: Amanda Vang  Procedure(s) Performed: Procedure(s) with comments: PARS PLANA VITRECTOMY WITH 25 GAUGE with endolaser  (Left) - add on 5pm INSERTION OF GAS (Left)  Patient Location: PACU  Anesthesia Type:MAC  Level of Consciousness: awake  Airway and Oxygen Therapy: Patient Spontanous Breathing  Post-op Pain: mild  Post-op Assessment: Post-op Vital signs reviewed, Patient's Cardiovascular Status Stable, Respiratory Function Stable, Patent Airway, No signs of Nausea or vomiting and Pain level controlled  Post-op Vital Signs: Reviewed and stable  Last Vitals:  Filed Vitals:   12/12/14 1800  BP: 137/73  Pulse: 83  Temp: 37.1 C  Resp:     Complications: No apparent anesthesia complications

## 2014-12-12 NOTE — Progress Notes (Signed)
Per Eliberto Ivoryynthia Michaels, RN, no orders placed in Epic by Dr. Luciana Axeankin, she called office and Dr. Luciana Axeankin is aware. Pt's pre op completed except for any new orders Dr. Luciana Axeankin places, will monitor.

## 2014-12-12 NOTE — Transfer of Care (Signed)
Immediate Anesthesia Transfer of Care Note  Patient: Amanda Vang  Procedure(s) Performed: Procedure(s) with comments: PARS PLANA VITRECTOMY WITH 25 GAUGE with endolaser  (Left) - add on 5pm INSERTION OF GAS (Left)  Patient Location: PACU  Anesthesia Type:MAC  Level of Consciousness: awake and alert   Airway & Oxygen Therapy: Patient Spontanous Breathing and Patient connected to nasal cannula oxygen  Post-op Assessment: Report given to RN and Post -op Vital signs reviewed and stable  Post vital signs: Reviewed and stable  Last Vitals:  Filed Vitals:   12/12/14 1720  BP:   Pulse:   Temp: 37.1 C  Resp:     Complications: No apparent anesthesia complications

## 2014-12-12 NOTE — H&P (Signed)
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  Patient has 1-2 day history of visual field loss inferiorly and inferotemporally in the left eye. Under to have on examination rhegmatogenous detachment left eye, macula on, pseudophakic. She has excellent visual acuity in the left eye.  Past medical history is insignificant for significant cardiovascular or lung disease. Patient has. history of a bilateral cataract surgery with intraocular lens placement  Examination discloses a large retinal breaks the superonasal quadrant of the left eye. Rhegmatogenous detachment superonasal quadrant of the left eye.  Impression rhegmatogenous detachment left eye superonasal retinal breaks, macula on, pseudophakic left eye. Plan surgical repair via vitrectomy, endolaser photo coagulation, gas injection to repair complex retinal detachment of the left eye. Plan is for local retrobulbar anesthesia with monitored anesthesia control.  Patient understands the risk of anesthesia and  also to the eye from the underlying condition including but limited to hemorrhage, infection, scarring, need another surgery nontender, loss resume progressive disease despite intervention.

## 2014-12-12 NOTE — Anesthesia Preprocedure Evaluation (Addendum)
Anesthesia Evaluation  Patient identified by MRN, date of birth, ID band Patient awake    Reviewed: Allergy & Precautions, H&P , NPO status , Patient's Chart, lab work & pertinent test results  Airway Mallampati: I  TM Distance: >3 FB Neck ROM: Full    Dental  (+) Teeth Intact   Pulmonary former smoker,    Pulmonary exam normal       Cardiovascular negative cardio ROS  Rhythm:Regular Rate:Normal     Neuro/Psych negative neurological ROS  negative psych ROS   GI/Hepatic negative GI ROS, Neg liver ROS,   Endo/Other  negative endocrine ROS  Renal/GU negative Renal ROS     Musculoskeletal negative musculoskeletal ROS (+)   Abdominal Normal abdominal exam  (+)   Peds  Hematology negative hematology ROS (+)   Anesthesia Other Findings   Reproductive/Obstetrics                             Anesthesia Physical  Anesthesia Plan  ASA: II  Anesthesia Plan: MAC   Post-op Pain Management:    Induction: Intravenous  Airway Management Planned: Nasal Cannula  Additional Equipment:   Intra-op Plan:   Post-operative Plan:   Informed Consent: I have reviewed the patients History and Physical, chart, labs and discussed the procedure including the risks, benefits and alternatives for the proposed anesthesia with the patient or authorized representative who has indicated his/her understanding and acceptance.     Plan Discussed with: CRNA  Anesthesia Plan Comments:         Anesthesia Quick Evaluation

## 2014-12-16 ENCOUNTER — Encounter (HOSPITAL_COMMUNITY): Payer: Self-pay | Admitting: Ophthalmology

## 2015-11-10 ENCOUNTER — Encounter (HOSPITAL_COMMUNITY): Payer: Self-pay

## 2017-11-21 ENCOUNTER — Other Ambulatory Visit (HOSPITAL_COMMUNITY): Payer: Self-pay | Admitting: Internal Medicine

## 2017-11-21 ENCOUNTER — Ambulatory Visit (HOSPITAL_COMMUNITY)
Admission: RE | Admit: 2017-11-21 | Discharge: 2017-11-21 | Disposition: A | Discharge status: Home / Self Care | Payer: Medicare Other | Source: Ambulatory Visit | Attending: Internal Medicine | Admitting: Internal Medicine

## 2017-11-21 DIAGNOSIS — R059 Cough, unspecified: Secondary | ICD-10-CM

## 2017-11-21 DIAGNOSIS — R05 Cough: Secondary | ICD-10-CM

## 2017-11-21 DIAGNOSIS — R918 Other nonspecific abnormal finding of lung field: Secondary | ICD-10-CM | POA: Diagnosis not present

## 2017-11-22 ENCOUNTER — Other Ambulatory Visit (HOSPITAL_COMMUNITY): Payer: Self-pay | Admitting: Internal Medicine

## 2017-11-22 ENCOUNTER — Ambulatory Visit (HOSPITAL_COMMUNITY)
Admission: RE | Admit: 2017-11-22 | Discharge: 2017-11-22 | Disposition: A | Discharge status: Home / Self Care | Payer: Medicare Other | Source: Ambulatory Visit | Attending: Internal Medicine | Admitting: Internal Medicine

## 2017-11-22 DIAGNOSIS — R918 Other nonspecific abnormal finding of lung field: Secondary | ICD-10-CM | POA: Insufficient documentation

## 2017-11-22 DIAGNOSIS — I700 Atherosclerosis of aorta: Secondary | ICD-10-CM | POA: Diagnosis not present

## 2017-11-22 DIAGNOSIS — R059 Cough, unspecified: Secondary | ICD-10-CM

## 2017-11-22 DIAGNOSIS — R05 Cough: Secondary | ICD-10-CM | POA: Insufficient documentation

## 2017-12-13 ENCOUNTER — Other Ambulatory Visit (HOSPITAL_COMMUNITY): Payer: Self-pay | Admitting: Internal Medicine

## 2017-12-13 DIAGNOSIS — R1032 Left lower quadrant pain: Secondary | ICD-10-CM

## 2017-12-13 DIAGNOSIS — R112 Nausea with vomiting, unspecified: Secondary | ICD-10-CM

## 2017-12-20 ENCOUNTER — Ambulatory Visit (HOSPITAL_COMMUNITY)
Admission: RE | Admit: 2017-12-20 | Discharge: 2017-12-20 | Disposition: A | Discharge status: Home / Self Care | Payer: Medicare Other | Source: Ambulatory Visit | Attending: Internal Medicine | Admitting: Internal Medicine

## 2017-12-20 ENCOUNTER — Other Ambulatory Visit (HOSPITAL_COMMUNITY): Payer: Self-pay | Admitting: Internal Medicine

## 2017-12-20 DIAGNOSIS — R911 Solitary pulmonary nodule: Secondary | ICD-10-CM

## 2017-12-20 DIAGNOSIS — R918 Other nonspecific abnormal finding of lung field: Secondary | ICD-10-CM | POA: Insufficient documentation

## 2017-12-29 ENCOUNTER — Ambulatory Visit (HOSPITAL_COMMUNITY)
Admission: RE | Admit: 2017-12-29 | Discharge: 2017-12-29 | Disposition: A | Discharge status: Home / Self Care | Payer: Medicare Other | Source: Ambulatory Visit | Attending: Internal Medicine | Admitting: Internal Medicine

## 2017-12-29 DIAGNOSIS — R1032 Left lower quadrant pain: Secondary | ICD-10-CM

## 2017-12-29 DIAGNOSIS — R112 Nausea with vomiting, unspecified: Secondary | ICD-10-CM

## 2017-12-29 MED ORDER — BARIUM SULFATE 2.1 % PO SUSP
ORAL | Status: DC
Start: 2017-12-29 — End: 2017-12-30
  Filled 2017-12-29: qty 2

## 2018-01-04 ENCOUNTER — Ambulatory Visit (HOSPITAL_COMMUNITY)
Admission: RE | Admit: 2018-01-04 | Discharge: 2018-01-04 | Disposition: A | Discharge status: Home / Self Care | Payer: Medicare Other | Source: Ambulatory Visit | Attending: Internal Medicine | Admitting: Internal Medicine

## 2018-01-04 DIAGNOSIS — R112 Nausea with vomiting, unspecified: Secondary | ICD-10-CM | POA: Diagnosis not present

## 2018-01-04 DIAGNOSIS — R1032 Left lower quadrant pain: Secondary | ICD-10-CM | POA: Diagnosis not present

## 2018-01-04 DIAGNOSIS — R918 Other nonspecific abnormal finding of lung field: Secondary | ICD-10-CM | POA: Diagnosis not present

## 2018-01-04 MED ORDER — IOPAMIDOL (ISOVUE-300) INJECTION 61%
100.0000 mL | Freq: Once | INTRAVENOUS | Status: AC | PRN
  Administered 2018-01-04: 100 mL via INTRAVENOUS

## 2018-04-24 ENCOUNTER — Other Ambulatory Visit: Payer: Self-pay | Admitting: Internal Medicine

## 2018-04-24 ENCOUNTER — Ambulatory Visit (HOSPITAL_COMMUNITY)
Admission: RE | Admit: 2018-04-24 | Discharge: 2018-04-24 | Disposition: A | Discharge status: Home / Self Care | Payer: Medicare Other | Source: Ambulatory Visit | Attending: Adult Health Nurse Practitioner | Admitting: Adult Health Nurse Practitioner

## 2018-04-24 ENCOUNTER — Other Ambulatory Visit (HOSPITAL_COMMUNITY): Payer: Self-pay | Admitting: Adult Health Nurse Practitioner

## 2018-04-24 DIAGNOSIS — M7989 Other specified soft tissue disorders: Secondary | ICD-10-CM | POA: Diagnosis not present

## 2018-04-24 DIAGNOSIS — M79601 Pain in right arm: Secondary | ICD-10-CM | POA: Diagnosis not present

## 2018-04-24 DIAGNOSIS — Z9181 History of falling: Secondary | ICD-10-CM | POA: Diagnosis present

## 2018-04-24 DIAGNOSIS — W19XXXD Unspecified fall, subsequent encounter: Secondary | ICD-10-CM

## 2019-11-05 DIAGNOSIS — Z1231 Encounter for screening mammogram for malignant neoplasm of breast: Secondary | ICD-10-CM | POA: Diagnosis not present

## 2020-01-02 DIAGNOSIS — L570 Actinic keratosis: Secondary | ICD-10-CM | POA: Diagnosis not present

## 2020-01-09 DIAGNOSIS — N952 Postmenopausal atrophic vaginitis: Secondary | ICD-10-CM | POA: Diagnosis not present

## 2020-01-09 DIAGNOSIS — Z01419 Encounter for gynecological examination (general) (routine) without abnormal findings: Secondary | ICD-10-CM | POA: Diagnosis not present

## 2020-03-10 DIAGNOSIS — R1030 Lower abdominal pain, unspecified: Secondary | ICD-10-CM | POA: Diagnosis not present

## 2020-03-10 DIAGNOSIS — E782 Mixed hyperlipidemia: Secondary | ICD-10-CM | POA: Diagnosis not present

## 2020-03-10 DIAGNOSIS — L723 Sebaceous cyst: Secondary | ICD-10-CM | POA: Diagnosis not present

## 2020-03-10 DIAGNOSIS — R05 Cough: Secondary | ICD-10-CM | POA: Diagnosis not present

## 2020-03-10 DIAGNOSIS — M25569 Pain in unspecified knee: Secondary | ICD-10-CM | POA: Diagnosis not present

## 2020-03-10 DIAGNOSIS — J019 Acute sinusitis, unspecified: Secondary | ICD-10-CM | POA: Diagnosis not present

## 2020-03-10 DIAGNOSIS — Z0000 Encounter for general adult medical examination without abnormal findings: Secondary | ICD-10-CM | POA: Diagnosis not present

## 2020-03-10 DIAGNOSIS — R7301 Impaired fasting glucose: Secondary | ICD-10-CM | POA: Diagnosis not present

## 2020-03-10 DIAGNOSIS — J309 Allergic rhinitis, unspecified: Secondary | ICD-10-CM | POA: Diagnosis not present

## 2020-03-12 DIAGNOSIS — Z0001 Encounter for general adult medical examination with abnormal findings: Secondary | ICD-10-CM | POA: Diagnosis not present

## 2020-03-12 DIAGNOSIS — R7301 Impaired fasting glucose: Secondary | ICD-10-CM | POA: Diagnosis not present

## 2020-03-12 DIAGNOSIS — E782 Mixed hyperlipidemia: Secondary | ICD-10-CM | POA: Diagnosis not present

## 2020-03-20 ENCOUNTER — Encounter (INDEPENDENT_AMBULATORY_CARE_PROVIDER_SITE_OTHER): Payer: Self-pay | Admitting: Ophthalmology

## 2020-03-20 ENCOUNTER — Other Ambulatory Visit: Payer: Self-pay

## 2020-03-20 ENCOUNTER — Ambulatory Visit (INDEPENDENT_AMBULATORY_CARE_PROVIDER_SITE_OTHER): Payer: Medicare PPO | Admitting: Ophthalmology

## 2020-03-20 DIAGNOSIS — Z8669 Personal history of other diseases of the nervous system and sense organs: Secondary | ICD-10-CM | POA: Diagnosis not present

## 2020-03-20 DIAGNOSIS — H43391 Other vitreous opacities, right eye: Secondary | ICD-10-CM | POA: Insufficient documentation

## 2020-03-20 DIAGNOSIS — H31002 Unspecified chorioretinal scars, left eye: Secondary | ICD-10-CM

## 2020-03-20 DIAGNOSIS — H43811 Vitreous degeneration, right eye: Secondary | ICD-10-CM

## 2020-03-20 NOTE — Assessment & Plan Note (Signed)
The nature of posterior vitreous detachment was discussed with the patient as well as its physiology, its age prevalence, and its possible implication regarding retinal breaks and detachment.  An informational brochure was given to the patient.  All the patient's questions were answered.  The patient was asked to return if new or different flashes or floaters develops.   Patient was instructed to contact office immediately if any changes were noticed. I explained to the patient that vitreous inside the eye is similar to jello inside a bowl. As the jello melts it can start to pull away from the bowl, similarly the vitreous throughout our lives can begin to pull away from the retina. That process is called a posterior vitreous detachment. In some cases, the vitreous can tug hard enough on the retina to form a retinal tear. I discussed with the patient the signs and symptoms of a retinal detachment.  Do not rub the eye.  No holes or tears OD

## 2020-03-20 NOTE — Patient Instructions (Signed)
Patient is notify us if new onset symptoms of retinal tear or detachment develop, including flashes and floaters

## 2020-03-20 NOTE — Progress Notes (Signed)
03/20/2020     CHIEF COMPLAINT Patient presents for Retina Follow Up   HISTORY OF PRESENT ILLNESS: Amanda Vang is a 69 y.o. female who presents to the clinic today for:   HPI    Retina Follow Up    Patient presents with  Other.  In both eyes.  This started 1 year ago.  Severity is mild.  Duration of 1 year.  Since onset it is stable.          Comments    1 Year F/U OU  Pt reports stable intermittent floater OD. No flashes of light reported OU. No changes to [place] OU.       Last edited by Ileana Roup, COA on 03/20/2020  8:35 AM. (History)      Referring physician: Benita Stabile, MD [address] [place],  [place] [address]  HISTORICAL INFORMATION:   Selected notes from the MEDICAL RECORD NUMBER       CURRENT MEDICATIONS: No current outpatient medications on file. (Ophthalmic Drugs)   No current facility-administered medications for this visit. (Ophthalmic Drugs)   Current Outpatient Medications (Other)  Medication Sig  . Calcium Carbonate-Vitamin D (CALCIUM 600+D) 600-400 MG-UNIT per tablet Take 1 tablet by mouth 2 (two) times daily.    Marland Kitchen DENAVIR 1 % cream Apply 1 application topically as needed. Fever Blister  . Estradiol (VAGIFEM) 10 MCG TABS Place 10 mcg vaginally 2 (two) times a week. Sun and Thurs  . ibuprofen (ADVIL,MOTRIN) 200 MG tablet Take 200 mg by mouth every 6 (six) hours as needed. Pain   . loratadine (CLARITIN) 10 MG tablet Take 10 mg by mouth daily as needed for allergies.   . meloxicam (MOBIC) 15 MG tablet Take 15 mg by mouth daily.  . Multiple Vitamin (MULTIVITAMIN WITH MINERALS) TABS Take 1 tablet by mouth daily.   No current facility-administered medications for this visit. (Other)      REVIEW OF SYSTEMS:    ALLERGIES No Known Allergies  PAST MEDICAL HISTORY Past Medical History:  Diagnosis Date  . Medical history non-contributory   . No pertinent past medical history    Past Surgical History:  Procedure Laterality Date  .  CATARACT EXTRACTION W/PHACO  04/05/2011   Procedure: CATARACT EXTRACTION PHACO AND INTRAOCULAR LENS PLACEMENT (IOC);  Surgeon: Gemma Payor;  Location: [facility];  Service: Ophthalmology;  Laterality: Left;  CDE 11.84  . CATARACT EXTRACTION W/PHACO  09/04/2012   Procedure: CATARACT EXTRACTION PHACO AND INTRAOCULAR LENS PLACEMENT (IOC);  Surgeon: Gemma Payor, MD;  Location: [facility];  Service: Ophthalmology;  Laterality: Right;  CDE=14.70  . colonoscopy  12/25/10   normal  . GAS INSERTION Left 12/12/2014   Procedure: INSERTION OF GAS;  Surgeon: Edmon Crape, MD;  Location: [facility] OR;  Service: Ophthalmology;  Laterality: Left;  . PARS PLANA VITRECTOMY Left 12/12/2014   Procedure: PARS PLANA VITRECTOMY WITH 25 GAUGE with endolaser ;  Surgeon: Edmon Crape, MD;  Location: [facility] OR;  Service: Ophthalmology;  Laterality: Left;  add on 5pm  . TUBAL LIGATION      FAMILY HISTORY Family History  Problem Relation Age of Onset  . Anesthesia problems Neg Hx   . Hypotension Neg Hx   . Malignant hyperthermia Neg Hx   . Pseudochol deficiency Neg Hx     SOCIAL HISTORY Social History   Tobacco Use  . Smoking status: Former Smoker    Packs/day: 0.25    Years: 2.00    Pack  years: 0.50    Types: Cigarettes    Quit date: 03/29/1971    Years since quitting: 49.0  . Smokeless tobacco: Never Used  Substance Use Topics  . Alcohol use: Yes    Alcohol/week: 7.0 standard drinks    Types: 7 Glasses of wine per week  . Drug use: No         OPHTHALMIC EXAM:  Base Eye Exam    Visual Acuity (ETDRS)      Right Left   Dist [place] 20/50 -2 20/20 -2   Dist ph [place] 20/20 -2        Tonometry (Tonopen, 8:40 AM)      Right Left   Pressure 10 13       Pupils      Pupils Dark Light Shape React APD   Right PERRL 4 3 Round Brisk None   Left PERRL 4 3 Round Brisk None       Visual Fields (Counting fingers)      Left Right    Full Full       Extraocular Movement      Right Left    Full Full       Neuro/Psych     Oriented x3: Yes   Mood/Affect: Normal       Dilation    Both eyes: 1.0% Mydriacyl, 2.5% Phenylephrine @ 8:40 AM        Slit Lamp and Fundus Exam    External Exam      Right Left   External Normal Normal       Slit Lamp Exam      Right Left   Lids/Lashes Normal Normal   Conjunctiva/Sclera White and quiet White and quiet   Cornea Clear Clear   Anterior Chamber Deep and quiet Deep and quiet   Iris Round and reactive Round and reactive   Lens Centered posterior chamber intraocular lens Centered posterior chamber intraocular lens   Anterior Vitreous Normal Normal       Fundus Exam      Right Left   Posterior Vitreous Posterior vitreous detachment Clear vitrectomized   Disc Normal Normal   C/D Ratio 0.4 0.55   Macula Normal Normal   Vessels Normal Normal   Periphery Normal, no retinal breaks Good retinopexy inferiorly and prior retinotomy superior nasal to nerve          IMAGING AND PROCEDURES  Imaging and Procedures for 03/20/20  Color Fundus Photography Optos - OU - Both Eyes       Right Eye Progression has been stable. Disc findings include normal observations. Macula : normal observations. Vessels : normal observations. Periphery : normal observations.   Left Eye Progression has been stable. Disc findings include normal observations. Vessels : normal observations.   Notes OD with posterior vitreous detachment and vitreous debris, OS, status post retinal detach repair 6 years previous, no changes                ASSESSMENT/PLAN:  Posterior vitreous detachment of right eye   The nature of posterior vitreous detachment was discussed with the patient as well as its physiology, its age prevalence, and its possible implication regarding retinal breaks and detachment.  An informational brochure was given to the patient.  All the patient's questions were answered.  The patient was asked to return if new or different flashes or floaters develops.   Patient was  instructed to contact office immediately if any changes were noticed. I explained to the  patient that vitreous inside the eye is similar to jello inside a bowl. As the jello melts it can start to pull away from the bowl, similarly the vitreous throughout our lives can begin to pull away from the retina. That process is called a posterior vitreous detachment. In some cases, the vitreous can tug hard enough on the retina to form a retinal tear. I discussed with the patient the signs and symptoms of a retinal detachment.  Do not rub the eye.  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 Posterior vitreous detachment of right eye  H43.811 Color Fundus Photography Optos - OU - Both Eyes  3. Vitreous syneresis, right  H43.391 Color Fundus Photography Optos - OU - Both Eyes  4. History of retinal detachment  Z86.69     1.  OS looks great, status post retinal detachment repair moreover also status post macular hole repair August 2016.  2.  OD with posterior vitreous detachment  3.  Ophthalmic Meds Ordered this visit:  No orders of the defined types were placed in this encounter.      Return in about 1 year (around 03/20/2021) for DILATE OU, COLOR FP, OCT.  Patient Instructions  Patient is notify us if new onset symptoms of retinal tear or detachment develop, including flashes and floaters    Explained the diagnoses, plan, and follow up with the patient and they expressed understanding.  Patient expressed understanding of the importance of proper follow up care.   Alford Highland Shaniah Baltes M.D. Diseases & Surgery of the Retina and Vitreous [facility] 03/20/20     Abbreviations: M myopia (nearsighted); A astigmatism; H hyperopia (farsighted); P presbyopia; Mrx spectacle prescription;  CTL contact lenses; OD right eye; OS left eye; OU both eyes  XT exotropia; ET esotropia; PEK punctate  epithelial keratitis; PEE punctate epithelial erosions; DES dry eye syndrome; MGD meibomian gland dysfunction; ATs artificial tears; PFAT's preservative free artificial tears; NSC nuclear sclerotic cataract; PSC posterior subcapsular cataract; ERM epi-retinal membrane; PVD posterior vitreous detachment; RD retinal detachment; DM diabetes mellitus; DR diabetic retinopathy; NPDR non-proliferative diabetic retinopathy; PDR proliferative diabetic retinopathy; CSME clinically significant macular edema; DME diabetic macular edema; dbh dot blot hemorrhages; CWS cotton wool spot; POAG primary open angle glaucoma; C/D cup-to-disc ratio; HVF humphrey visual field; GVF goldmann visual field; OCT optical coherence tomography; IOP intraocular pressure; BRVO Branch retinal vein occlusion; CRVO central retinal vein occlusion; CRAO central retinal artery occlusion; BRAO branch retinal artery occlusion; RT retinal tear; SB scleral buckle; PPV pars plana vitrectomy; VH Vitreous hemorrhage; PRP panretinal laser photocoagulation; IVK intravitreal kenalog; VMT vitreomacular traction; MH Macular hole;  NVD neovascularization of the disc; NVE neovascularization elsewhere; AREDS age related eye disease study; ARMD age related macular degeneration; POAG primary open angle glaucoma; EBMD epithelial/anterior basement membrane dystrophy; ACIOL anterior chamber intraocular lens; IOL intraocular lens; PCIOL posterior chamber intraocular lens; Phaco/IOL phacoemulsification with intraocular lens placement; PRK photorefractive keratectomy; LASIK laser assisted in situ keratomileusis; HTN hypertension; DM diabetes mellitus; COPD chronic obstructive pulmonary disease

## 2020-06-13 DIAGNOSIS — M5431 Sciatica, right side: Secondary | ICD-10-CM | POA: Diagnosis not present

## 2020-06-13 DIAGNOSIS — Z23 Encounter for immunization: Secondary | ICD-10-CM | POA: Diagnosis not present

## 2020-10-17 DIAGNOSIS — Z23 Encounter for immunization: Secondary | ICD-10-CM | POA: Diagnosis not present

## 2020-11-10 DIAGNOSIS — Z1231 Encounter for screening mammogram for malignant neoplasm of breast: Secondary | ICD-10-CM | POA: Diagnosis not present

## 2020-12-24 ENCOUNTER — Other Ambulatory Visit: Payer: Self-pay

## 2020-12-24 ENCOUNTER — Ambulatory Visit (HOSPITAL_COMMUNITY)
Admission: RE | Admit: 2020-12-24 | Discharge: 2020-12-24 | Disposition: A | Discharge status: Home / Self Care | Payer: Medicare PPO | Source: Ambulatory Visit | Attending: Family Medicine | Admitting: Family Medicine

## 2020-12-24 ENCOUNTER — Other Ambulatory Visit (HOSPITAL_COMMUNITY): Payer: Self-pay | Admitting: Family Medicine

## 2020-12-24 DIAGNOSIS — M7989 Other specified soft tissue disorders: Secondary | ICD-10-CM | POA: Diagnosis not present

## 2020-12-24 DIAGNOSIS — M25532 Pain in left wrist: Secondary | ICD-10-CM

## 2020-12-31 DIAGNOSIS — L570 Actinic keratosis: Secondary | ICD-10-CM | POA: Diagnosis not present

## 2020-12-31 DIAGNOSIS — Z85828 Personal history of other malignant neoplasm of skin: Secondary | ICD-10-CM | POA: Diagnosis not present

## 2020-12-31 DIAGNOSIS — L814 Other melanin hyperpigmentation: Secondary | ICD-10-CM | POA: Diagnosis not present

## 2020-12-31 DIAGNOSIS — M25532 Pain in left wrist: Secondary | ICD-10-CM | POA: Diagnosis not present

## 2020-12-31 DIAGNOSIS — Z1283 Encounter for screening for malignant neoplasm of skin: Secondary | ICD-10-CM | POA: Diagnosis not present

## 2021-01-09 ENCOUNTER — Encounter: Payer: Self-pay | Admitting: Orthopedic Surgery

## 2021-01-09 ENCOUNTER — Ambulatory Visit (INDEPENDENT_AMBULATORY_CARE_PROVIDER_SITE_OTHER): Payer: Medicare PPO | Admitting: Orthopedic Surgery

## 2021-01-09 DIAGNOSIS — M24132 Other articular cartilage disorders, left wrist: Secondary | ICD-10-CM

## 2021-01-09 NOTE — Addendum Note (Signed)
Addended byPrescott Parma on: 01/09/2021 09:33 AM   Modules accepted: Orders

## 2021-01-09 NOTE — Progress Notes (Signed)
Office Visit Note   Patient: Amanda Vang           Date of Birth: 10-30-1950           MRN: [id number] Visit Date: 01/09/2021 Requested by: Benita Stabile, MD [address] [place],  [place] [address] PCP: Benita Stabile, MD  Subjective: No chief complaint on file.   HPI: Amanda Vang is a 70 year old patient with left wrist pain.  Injured the left wrist approximately 6 weeks ago playing golf.  Felt a pop at the top of her follow-through after hitting 7-iron over the leg.  Has not played since.  Has tried over-the-counter anti-inflammatory wrist bracing and she still has pain with activities of daily living localizing to the ulnar aspect of that left wrist.  Had mild swelling at the time.  Has not been able to play golf since then.  Reports continued symptoms of both pain and mechanical symptoms localizing to the left wrist region.  She is right-hand dominant.              ROS: All systems reviewed are negative as they relate to the chief complaint within the history of present illness.  Patient denies  fevers or chills.   Assessment & Plan: Visit Diagnoses:  1. 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  Statistically this likely represents a degenerative TFCC tear versus partial or complete ECU rupture or subluxation.  Plain radiographs reviewed negative for fracture.  Patient has had some work-up for some erosive changes seen in the metacarpals and intercarpal region which was also negative for rheumatoid arthritis.  Continued pain with dorsiflexion with ADLs.  6 weeks of symptoms with failure of conservative management.  Plan for MRI arthrogram left wrist to evaluate ECU tendon for subluxation/partial tearing as well as possible TFCC tear.  Follow-up after that study.  Follow-Up Instructions: Return for after MRI.   Orders:  No orders of the  defined types were placed in this encounter.  No orders of the defined types were placed in this encounter.     Procedures: No procedures performed   Clinical Data: No additional findings.  Objective: Vital Signs: There were no vitals taken for this visit.  Physical Exam:   Constitutional: Patient appears well-developed HEENT:  Head: Normocephalic Eyes:EOM are normal Neck: Normal range of motion Cardiovascular: Normal rate Pulmonary/chest: Effort normal Neurologic: Patient is alert Skin: Skin is warm Psychiatric: Patient has normal mood and affect    Ortho Exam: Ortho exam demonstrates limited dorsiflexion of the left wrist about 10 degrees compared to the right wrist.  Does have TFCC tenderness as well as ECU tenderness.  No definite subluxation with pronation supination.  Pronation supination full.  No pain with pronation supination of the left or right wrist.  Patient has intact radial pulse.  Pain radiates along the fifth metacarpal dorsally and proximally along the dorsal ulnar.  Specialty Comments:  No specialty comments available.  Imaging: No results found.   PMFS History: Patient Active Problem List   Diagnosis Date Noted  . Posterior vitreous detachment of right eye 03/20/2020  . Vitreous syneresis, right 03/20/2020  . Chorioretinal scar of left eye 03/20/2020  . History of retinal detachment 03/20/2020  . Chest pain 09/28/2011   Past Medical History:  Diagnosis Date  . Medical history non-contributory   . No pertinent past medical history  Family History  Problem Relation Age of Onset  . Anesthesia problems Neg Hx   . Hypotension Neg Hx   . Malignant hyperthermia Neg Hx   . Pseudochol deficiency Neg Hx     Past Surgical History:  Procedure Laterality Date  . CATARACT EXTRACTION W/PHACO  04/05/2011   Procedure: CATARACT EXTRACTION PHACO AND INTRAOCULAR LENS PLACEMENT (IOC);  Surgeon: Gemma Payor;  Location: [facility];  Service: Ophthalmology;   Laterality: Left;  CDE 11.84  . CATARACT EXTRACTION W/PHACO  09/04/2012   Procedure: CATARACT EXTRACTION PHACO AND INTRAOCULAR LENS PLACEMENT (IOC);  Surgeon: Gemma Payor, MD;  Location: [facility];  Service: Ophthalmology;  Laterality: Right;  CDE=14.70  . colonoscopy  12/25/10   normal  . GAS INSERTION Left 12/12/2014   Procedure: INSERTION OF GAS;  Surgeon: Edmon Crape, MD;  Location: [facility] OR;  Service: Ophthalmology;  Laterality: Left;  . PARS PLANA VITRECTOMY Left 12/12/2014   Procedure: PARS PLANA VITRECTOMY WITH 25 GAUGE with endolaser ;  Surgeon: Edmon Crape, MD;  Location: [facility] OR;  Service: Ophthalmology;  Laterality: Left;  add on 5pm  . TUBAL LIGATION     Social History   Occupational History  . Not on file  Tobacco Use  . Smoking status: Former Smoker    Packs/day: 0.25    Years: 2.00    Pack years: 0.50    Types: Cigarettes    Quit date: 03/29/1971    Years since quitting: 49.8  . Smokeless tobacco: Never Used  Substance and Sexual Activity  . Alcohol use: Yes    Alcohol/week: 7.0 standard drinks    Types: 7 Glasses of wine per week  . Drug use: No  . Sexual activity: Yes    Birth control/protection: None

## 2021-01-16 DIAGNOSIS — Z01419 Encounter for gynecological examination (general) (routine) without abnormal findings: Secondary | ICD-10-CM | POA: Diagnosis not present

## 2021-01-19 ENCOUNTER — Ambulatory Visit
Admission: RE | Admit: 2021-01-19 | Discharge: 2021-01-19 | Disposition: A | Discharge status: Home / Self Care | Payer: Medicare PPO | Source: Ambulatory Visit | Attending: Orthopedic Surgery | Admitting: Orthopedic Surgery

## 2021-01-19 ENCOUNTER — Other Ambulatory Visit: Payer: Self-pay

## 2021-01-19 DIAGNOSIS — S6992XA Unspecified injury of left wrist, hand and finger(s), initial encounter: Secondary | ICD-10-CM | POA: Diagnosis not present

## 2021-01-19 DIAGNOSIS — M24832 Other specific joint derangements of left wrist, not elsewhere classified: Secondary | ICD-10-CM | POA: Diagnosis not present

## 2021-01-19 DIAGNOSIS — M24132 Other articular cartilage disorders, left wrist: Secondary | ICD-10-CM

## 2021-01-19 MED ORDER — IOPAMIDOL (ISOVUE-M 200) INJECTION 41%
2.0000 mL | Freq: Once | INTRAMUSCULAR | Status: AC
  Administered 2021-01-19: 2 mL via INTRA_ARTICULAR

## 2021-01-21 ENCOUNTER — Telehealth: Payer: Self-pay

## 2021-01-21 NOTE — Telephone Encounter (Signed)
Patient wanting to know results of left wrist MRI. Can you call her with results/plan? [phone]

## 2021-01-21 NOTE — Telephone Encounter (Signed)
noted 

## 2021-01-21 NOTE — Telephone Encounter (Signed)
I called and left message on her machine.  Essentially she can wait it out get an injection or maybe have Charlie do wrist arthroscopy debridement and ulnar shortening.  She will let me know what she wants to do probably at her next appointment.

## 2021-01-28 ENCOUNTER — Ambulatory Visit: Payer: Self-pay

## 2021-01-28 ENCOUNTER — Ambulatory Visit: Payer: Medicare PPO | Admitting: Orthopedic Surgery

## 2021-01-28 DIAGNOSIS — M24132 Other articular cartilage disorders, left wrist: Secondary | ICD-10-CM

## 2021-02-01 ENCOUNTER — Encounter: Payer: Self-pay | Admitting: Orthopedic Surgery

## 2021-02-01 DIAGNOSIS — M24132 Other articular cartilage disorders, left wrist: Secondary | ICD-10-CM

## 2021-02-01 MED ORDER — METHYLPREDNISOLONE ACETATE 40 MG/ML IJ SUSP
13.3300 mg | INTRAMUSCULAR | Status: AC | PRN
  Administered 2021-02-01: 13.33 mg via INTRA_ARTICULAR

## 2021-02-01 MED ORDER — LIDOCAINE HCL 1 % IJ SOLN
3.0000 mL | INTRAMUSCULAR | Status: AC | PRN
  Administered 2021-02-01: 3 mL

## 2021-02-01 MED ORDER — BUPIVACAINE HCL 0.25 % IJ SOLN
0.6600 mL | INTRAMUSCULAR | Status: AC | PRN
  Administered 2021-02-01: .66 mL via INTRA_ARTICULAR

## 2021-02-01 NOTE — Progress Notes (Signed)
Office Visit Note   Patient: Amanda Vang           Date of Birth: 11-21-1950           MRN: [id number] Visit Date: 01/28/2021 Requested by: Benita Stabile, MD [address] [place],  [place] [address] PCP: Benita Stabile, MD  Subjective: Chief Complaint  Patient presents with   Other     Scan review    HPI: Amanda Vang is a 70 year old patient with left wrist pain.  Here to review MRI scan.  Really no change in symptoms.  She has continued primarily ulnar-sided pain.  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  Mobic helps some but not tramadol.  She does have a diagnosis of osteopenia.  She has shut down her heavy wrist activity for the past 2 to 3 weeks without much relief.  MRI scan is reviewed and it does show positive ulnar variance with findings of ulnar abutment syndrome.  Scan is reviewed with the patient.  There is some central perforation of the TFCC articular disc with degenerative changes noted in the lunate and triquetrum.  Full thickness cartilage defect of the proximal ulnar side of the lunate measuring 5 mm.  Also a small linear cleft within the ECU tendon.              ROS: All systems reviewed are negative as they relate to the chief complaint within the history of present illness.  Patient denies  fevers or chills.   Assessment & Plan: Visit Diagnoses:  1. 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  We will try a left wrist injection today and follow-up in 6 weeks.  She may need to decide whether or not her wrist is bothering her enough for some type of arthroscopic debridement with ulnar shortening.  I would likely have her see our hand surgeon Dr. Frazier Butt at that time.  Follow-Up Instructions: Return in about 6 weeks (around 03/11/2021).   Orders:  Orders Placed This Encounter  Procedures   US  Guided Needle Placement - No Linked Charges   No orders of the defined types were placed in this encounter.     Procedures: Medium Joint Inj: L radiocarpal on 02/01/2021 10:03 AM Indications: pain and diagnostic evaluation Details: 22 G 1.5 in needle, ultrasound-guided dorsal approach Medications: 3 mL lidocaine 1 %; 13.33 mg methylPREDNISolone acetate 40 MG/ML; 0.66 mL bupivacaine 0.25 % Outcome: tolerated well, no immediate complications Procedure, treatment alternatives, risks and benefits explained, specific risks discussed. Consent was given by the patient. Immediately prior to procedure a time out was called to verify the correct patient, procedure, equipment, support staff and site/side marked as required. Patient was prepped and draped in the usual sterile fashion.      Clinical Data: No additional findings.  Objective: Vital Signs: There were no vitals taken for this visit.  Physical Exam:   Constitutional: Patient appears well-developed HEENT:  Head: Normocephalic Eyes:EOM are normal Neck: Normal range of motion Cardiovascular: Normal rate Pulmonary/chest: Effort normal Neurologic: Patient is alert Skin: Skin is warm Psychiatric: Patient has normal mood and affect   Ortho Exam: Ortho exam demonstrates pretty reasonable grip strength with full wrist range of motion.  Has good ECU tendon function with no subluxation with pronation supination.  Mild TFCC tenderness is present.  No instability at the distal radial ulnar joint.  Radial pulse intact.  Specialty Comments:  No specialty comments available.  Imaging: No results found.   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   History of retinal detachment 03/20/2020   Chest pain 09/28/2011   Past Medical History:  Diagnosis Date   Medical history  non-contributory    No pertinent past medical history     Family History  Problem Relation Age of Onset   Anesthesia problems Neg Hx    Hypotension Neg Hx    Malignant hyperthermia Neg Hx    Pseudochol deficiency Neg Hx     Past Surgical History:  Procedure Laterality Date   CATARACT EXTRACTION W/PHACO  04/05/2011   Procedure: CATARACT EXTRACTION PHACO AND INTRAOCULAR LENS PLACEMENT (IOC);  Surgeon: Gemma Payor;  Location: [facility];  Service: Ophthalmology;  Laterality: Left;  CDE 11.84   CATARACT EXTRACTION W/PHACO  09/04/2012   Procedure: CATARACT EXTRACTION PHACO AND INTRAOCULAR LENS PLACEMENT (IOC);  Surgeon: Gemma Payor, MD;  Location: [facility];  Service: Ophthalmology;  Laterality: Right;  CDE=14.70   colonoscopy  12/25/10   normal   GAS INSERTION Left 12/12/2014   Procedure: INSERTION OF GAS;  Surgeon: Edmon Crape, MD;  [facility] OR;  Service: Ophthalmology;  Laterality: Left;   PARS PLANA VITRECTOMY Left 12/12/2014   Procedure: PARS PLANA VITRECTOMY WITH 25 GAUGE with endolaser ;  Surgeon: Edmon Crape, MD;  Location: [facility] OR;  Service: Ophthalmology;  Laterality: Left;  add on 5pm   TUBAL LIGATION     Social History   Occupational History   Not on file  Tobacco Use   Smoking status: Former    Packs/day: 0.25    Years: 2.00    Pack years: 0.50    Types: Cigarettes    Quit date: 03/29/1971    Years since quitting: 49.8   Smokeless tobacco: Never  Substance and Sexual Activity   Alcohol use: Yes    Alcohol/week: 7.0 standard drinks    Types: 7 Glasses of wine per week   Drug use: No   Sexual activity: Yes    Birth control/protection: None

## 2021-03-11 ENCOUNTER — Other Ambulatory Visit: Payer: Self-pay

## 2021-03-11 ENCOUNTER — Ambulatory Visit: Payer: Medicare PPO | Admitting: Orthopedic Surgery

## 2021-03-11 DIAGNOSIS — M24132 Other articular cartilage disorders, left wrist: Secondary | ICD-10-CM

## 2021-03-12 ENCOUNTER — Encounter: Payer: Self-pay | Admitting: Orthopedic Surgery

## 2021-03-12 NOTE — Progress Notes (Signed)
Office Visit Note   Patient: Amanda Vang           Date of Birth: [birth year]           MRN: [id number] Visit Date: 03/11/2021 Requested by: Benita Stabile, MD [address] [place],  [place] [address] PCP: Benita Stabile, MD  Subjective: Chief Complaint  Patient presents with   Left Wrist - Follow-up    HPI: Amanda Vang is a patient with left wrist pain.  She had an injection on 01/28/2021.  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  She did get some relief but the soreness has recurred over the last several days.  She has been chipping and putting but not playing full out golf yet.  She was working in the garden and doing some picking which aggravated her wrist as well.  She states that her ADLs are okay except for gardening.  In general she is not really sure if she can live with the amount of pain she has in her wrist at this time.              ROS: All systems reviewed are negative as they relate to the chief complaint within the history of present illness.  Patient denies  fevers or chills.   Assessment & Plan: Visit Diagnoses:  1. 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  She also has degenerative TFCC tear.  This may or may not be a surgical problem.  She has exhausted nonoperative measures.  I think it would be advisable for her to try to resume playing golf and then follow-up with Charlie to evaluate for surgical treatment of the left wrist which may or may not include arthroscopy and/or ulnar shortening.  Follow-Up Instructions: No follow-ups on file.   Orders:  No orders of the defined types were placed in this encounter.  No orders of the defined types were placed in this encounter.     Procedures: No procedures performed   Clinical Data: No additional  findings.  Objective: Vital Signs: There were no vitals taken for this visit.  Physical Exam:   Constitutional: Patient appears well-developed HEENT:  Head: Normocephalic Eyes:EOM are normal Neck: Normal range of motion Cardiovascular: Normal rate Pulmonary/chest: Effort normal Neurologic: Patient is alert Skin: Skin is warm Psychiatric: Patient has normal mood and affect   Ortho Exam: Ortho exam demonstrates pretty reasonable grip strength with some tenderness dorsally.  Wrist range of motion parameters unchanged.  EPL FPL interosseous function intact.  No subluxation of the DRUJ with pronation or supination and no ECU tendon subluxation.  Specialty Comments:  No specialty comments available.  Imaging: No results found.   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   History of retinal detachment 03/20/2020   Chest pain 09/28/2011   Past Medical History:  Diagnosis Date   Medical history non-contributory    No pertinent past medical history     Family History  Problem Relation Age of Onset   Anesthesia problems Neg Hx    Hypotension Neg Hx    Malignant hyperthermia Neg Hx    Pseudochol deficiency Neg Hx  Past Surgical History:  Procedure Laterality Date   CATARACT EXTRACTION W/PHACO  04/05/2011   Procedure: CATARACT EXTRACTION PHACO AND INTRAOCULAR LENS PLACEMENT (IOC);  Surgeon: Gemma Payor;  Location: [facility];  Service: Ophthalmology;  Laterality: Left;  CDE 11.84   CATARACT EXTRACTION W/PHACO  09/04/2012   Procedure: CATARACT EXTRACTION PHACO AND INTRAOCULAR LENS PLACEMENT (IOC);  Surgeon: Gemma Payor, MD;  Location: [facility];  Service: Ophthalmology;  Laterality: Right;  CDE=14.70   colonoscopy  12/25/10   normal   GAS INSERTION Left 12/12/2014   Procedure: INSERTION OF GAS;  Surgeon: Edmon Crape, MD;   Location: [facility] OR;  Service: Ophthalmology;  Laterality: Left;   PARS PLANA VITRECTOMY Left 12/12/2014   Procedure: PARS PLANA VITRECTOMY WITH 25 GAUGE with endolaser ;  Surgeon: Edmon Crape, MD;  Location: [facility] OR;  Service: Ophthalmology;  Laterality: Left;  add on 5pm   TUBAL LIGATION     Social History   Occupational History   Not on file  Tobacco Use   Smoking status: Former    Packs/day: 0.25    Years: 2.00    Pack years: 0.50    Types: Cigarettes    Quit date: 03/29/1971    Years since quitting: 49.9   Smokeless tobacco: Never  Substance and Sexual Activity   Alcohol use: Yes    Alcohol/week: 7.0 standard drinks    Types: 7 Glasses of wine per week   Drug use: No   Sexual activity: Yes    Birth control/protection: None

## 2021-03-16 DIAGNOSIS — Z0000 Encounter for general adult medical examination without abnormal findings: Secondary | ICD-10-CM | POA: Diagnosis not present

## 2021-03-16 DIAGNOSIS — E782 Mixed hyperlipidemia: Secondary | ICD-10-CM | POA: Diagnosis not present

## 2021-03-16 DIAGNOSIS — Z1211 Encounter for screening for malignant neoplasm of colon: Secondary | ICD-10-CM | POA: Diagnosis not present

## 2021-03-16 DIAGNOSIS — M25532 Pain in left wrist: Secondary | ICD-10-CM | POA: Diagnosis not present

## 2021-03-23 ENCOUNTER — Encounter (INDEPENDENT_AMBULATORY_CARE_PROVIDER_SITE_OTHER): Payer: Self-pay | Admitting: Ophthalmology

## 2021-03-23 ENCOUNTER — Ambulatory Visit (INDEPENDENT_AMBULATORY_CARE_PROVIDER_SITE_OTHER): Payer: Medicare PPO | Admitting: Ophthalmology

## 2021-03-23 ENCOUNTER — Other Ambulatory Visit: Payer: Self-pay

## 2021-03-23 DIAGNOSIS — H31002 Unspecified chorioretinal scars, left eye: Secondary | ICD-10-CM

## 2021-03-23 DIAGNOSIS — H43811 Vitreous degeneration, right eye: Secondary | ICD-10-CM | POA: Diagnosis not present

## 2021-03-23 NOTE — Assessment & Plan Note (Signed)
No retinal holes or tears,  Some significant vitreous debris vitreous floaters occasionally bothers patient, not significant impact on activities of daily living

## 2021-03-23 NOTE — Progress Notes (Signed)
03/23/2021     CHIEF COMPLAINT Patient presents for Retina Follow Up   HISTORY OF PRESENT ILLNESS: Amanda Vang is a 70 y.o. female who presents to the clinic today for:   HPI     Retina Follow Up           Diagnosis: Other   Laterality: both eyes   Onset: 1 year ago   Severity: mild   Duration: 1 year   Course: stable         Comments   1 yr fu ou, oct, fp Patient states vision is stable and unchanged since last visit. Denies any new floaters or FOL. Floaters are stable OD.        Last edited by Nelva Nay, COA on 03/23/2021  8:13 AM.      Referring physician: Benita Stabile, MD [address] [place],  [place] [address]  HISTORICAL INFORMATION:   Selected notes from the MEDICAL RECORD NUMBER       CURRENT MEDICATIONS: No current outpatient medications on file. (Ophthalmic Drugs)   No current facility-administered medications for this visit. (Ophthalmic Drugs)   Current Outpatient Medications (Other)  Medication Sig   Calcium Carbonate-Vitamin D (CALCIUM 600+D) 600-400 MG-UNIT per tablet Take 1 tablet by mouth 2 (two) times daily.     DENAVIR 1 % cream Apply 1 application topically as needed. Fever Blister   Estradiol (VAGIFEM) 10 MCG TABS Place 10 mcg vaginally 2 (two) times a week. Wynelle Link and Thurs   ibuprofen (ADVIL,MOTRIN) 200 MG tablet Take 200 mg by mouth every 6 (six) hours as needed. Pain    loratadine (CLARITIN) 10 MG tablet Take 10 mg by mouth daily as needed for allergies.    meloxicam (MOBIC) 15 MG tablet Take 15 mg by mouth daily.   Multiple Vitamin (MULTIVITAMIN WITH MINERALS) TABS Take 1 tablet by mouth daily.   No current facility-administered medications for this visit. (Other)      REVIEW OF SYSTEMS:    ALLERGIES No Known Allergies  PAST MEDICAL HISTORY Past Medical History:  Diagnosis Date   Medical history non-contributory    No pertinent past medical history    Past Surgical History:  Procedure Laterality  Date   CATARACT EXTRACTION W/PHACO  04/05/2011   Procedure: CATARACT EXTRACTION PHACO AND INTRAOCULAR LENS PLACEMENT (IOC);  Surgeon: Gemma Payor;  Location: [facility];  Service: Ophthalmology;  Laterality: Left;  CDE 11.84   CATARACT EXTRACTION W/PHACO  09/04/2012   Procedure: CATARACT EXTRACTION PHACO AND INTRAOCULAR LENS PLACEMENT (IOC);  Surgeon: Gemma Payor, MD;  Location: [facility];  Service: Ophthalmology;  Laterality: Right;  CDE=14.70   colonoscopy  12/25/10   normal   GAS INSERTION Left 12/12/2014   Procedure: INSERTION OF GAS;  Surgeon: Edmon Crape, MD;  Location: [facility] OR;  Service: Ophthalmology;  Laterality: Left;   PARS PLANA VITRECTOMY Left 12/12/2014   Procedure: PARS PLANA VITRECTOMY WITH 25 GAUGE with endolaser ;  Surgeon: Edmon Crape, MD;  Location: [facility] OR;  Service: Ophthalmology;  Laterality: Left;  add on 5pm   TUBAL LIGATION      FAMILY HISTORY Family History  Problem Relation Age of Onset   Anesthesia problems Neg Hx    Hypotension Neg Hx    Malignant hyperthermia Neg Hx    Pseudochol deficiency Neg Hx     SOCIAL HISTORY Social History   Tobacco Use   Smoking status: Former    Packs/day: 0.25  Years: 2.00    Pack years: 0.50    Types: Cigarettes    Quit date: 03/29/1971    Years since quitting: 50.0   Smokeless tobacco: Never  Substance Use Topics   Alcohol use: Yes    Alcohol/week: 7.0 standard drinks    Types: 7 Glasses of wine per week   Drug use: No         OPHTHALMIC EXAM:  Base Eye Exam     Visual Acuity (ETDRS)       Right Left   Dist [place] 20/30 -2 20/30         Tonometry (Tonopen, 8:15 AM)       Right Left   Pressure 15 12         Pupils       Pupils Dark Light Shape React APD   Right PERRL 3 2 Round Brisk None   Left PERRL 3 2 Round Brisk None         Visual Fields (Counting fingers)       Left Right    Full Full         Extraocular Movement       Right Left    Full Full         Neuro/Psych     Oriented x3:  Yes   Mood/Affect: Normal         Dilation     Both eyes: 1.0% Mydriacyl, 2.5% Phenylephrine @ 8:15 AM           Slit Lamp and Fundus Exam     External Exam       Right Left   External Normal Normal         Slit Lamp Exam       Right Left   Lids/Lashes Normal Normal   Conjunctiva/Sclera White and quiet White and quiet   Cornea Clear Clear   Anterior Chamber Deep and quiet Deep and quiet   Iris Round and reactive Round and reactive   Lens Centered posterior chamber intraocular lens Centered posterior chamber intraocular lens   Anterior Vitreous Normal Normal         Fundus Exam       Right Left   Posterior Vitreous Posterior vitreous detachment, Central vitreous floaters Clear vitrectomized   Disc Normal Normal   C/D Ratio 0.4 0.55   Macula Normal Normal   Vessels Normal Normal   Periphery Normal, no retinal breaks Good retinopexy inferiorly and prior retinotomy superior nasal to nerve            IMAGING AND PROCEDURES  Imaging and Procedures for 03/23/21  OCT, Retina - OU - Both Eyes       Right Eye Quality was good. Scan locations included subfoveal. Central Foveal Thickness: 222. Progression has been stable. Findings include normal foveal contour.   Left Eye Quality was good. Scan locations included subfoveal. Central Foveal Thickness: 230. Progression has been stable. Findings include abnormal foveal contour.   Notes Irregular macular contour in an eye with post retinal detachment and reattachment changes OS no change     Color Fundus Photography Optos - OU - Both Eyes       Right Eye Progression has been stable. Disc findings include normal observations. Macula : normal observations. Vessels : normal observations. Periphery : normal observations.   Left Eye Progression has been stable. Disc findings include normal observations. Vessels : normal observations.   Notes OD with posterior vitreous detachment and vitreous debris,  OS,  status post retinal detach repair 6 years previous, no changes             ASSESSMENT/PLAN:  Posterior vitreous detachment of right eye No retinal holes or tears,  Some significant vitreous debris vitreous floaters occasionally bothers patient, not significant impact on activities of daily living      ICD-10-CM   1. Posterior vitreous detachment of right eye  H43.811 OCT, Retina - OU - Both Eyes    Color Fundus Photography Optos - OU - Both Eyes    2. 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  History of retinal detachment left eye, excellent visual acuity recovery.  2.  The no new retinal holes or tears OD.  Vitreous debris.  Membranes and strands not hampering activities of daily living OD, will observe  3.  Ophthalmic Meds Ordered this visit:  No orders of the defined types were placed in this encounter.      Return in about 1 year (around 03/23/2022) for DILATE OU, COLOR FP, OCT.  There are no Patient Instructions on file for this visit.   Explained the diagnoses, plan, and follow up with the patient and they expressed understanding.  Patient expressed understanding of the importance of proper follow up care.   Alford Highland Nikodem Leadbetter M.D. Diseases & Surgery of the Retina and Vitreous [facility] 03/23/21     Abbreviations: M myopia (nearsighted); A astigmatism; H hyperopia (farsighted); P presbyopia; Mrx spectacle prescription;  CTL contact lenses; OD right eye; OS left eye; OU both eyes  XT exotropia; ET esotropia; PEK punctate epithelial keratitis; PEE punctate epithelial erosions; DES dry eye syndrome; MGD meibomian gland dysfunction; ATs artificial tears; PFAT's preservative free artificial tears; NSC nuclear sclerotic cataract; PSC posterior subcapsular cataract; ERM epi-retinal membrane; PVD posterior vitreous detachment; RD retinal detachment; DM diabetes mellitus; DR diabetic retinopathy; NPDR non-proliferative diabetic retinopathy; PDR  proliferative diabetic retinopathy; CSME clinically significant macular edema; DME diabetic macular edema; dbh dot blot hemorrhages; CWS cotton wool spot; POAG primary open angle glaucoma; C/D cup-to-disc ratio; HVF humphrey visual field; GVF goldmann visual field; OCT optical coherence tomography; IOP intraocular pressure; BRVO Branch retinal vein occlusion; CRVO central retinal vein occlusion; CRAO central retinal artery occlusion; BRAO branch retinal artery occlusion; RT retinal tear; SB scleral buckle; PPV pars plana vitrectomy; VH Vitreous hemorrhage; PRP panretinal laser photocoagulation; IVK intravitreal kenalog; VMT vitreomacular traction; MH Macular hole;  NVD neovascularization of the disc; NVE neovascularization elsewhere; AREDS age related eye disease study; ARMD age related macular degeneration; POAG primary open angle glaucoma; EBMD epithelial/anterior basement membrane dystrophy; ACIOL anterior chamber intraocular lens; IOL intraocular lens; PCIOL posterior chamber intraocular lens; Phaco/IOL phacoemulsification with intraocular lens placement; PRK photorefractive keratectomy; LASIK laser assisted in situ keratomileusis; HTN hypertension; DM diabetes mellitus; COPD chronic obstructive pulmonary disease

## 2021-04-01 DIAGNOSIS — Z1211 Encounter for screening for malignant neoplasm of colon: Secondary | ICD-10-CM | POA: Diagnosis not present

## 2021-04-08 LAB — COLOGUARD: COLOGUARD: NEGATIVE

## 2021-04-08 LAB — EXTERNAL GENERIC LAB PROCEDURE: COLOGUARD: NEGATIVE

## 2021-04-14 ENCOUNTER — Other Ambulatory Visit: Payer: Self-pay

## 2021-04-14 ENCOUNTER — Ambulatory Visit: Payer: Medicare PPO | Admitting: Orthopedic Surgery

## 2021-04-14 ENCOUNTER — Ambulatory Visit (INDEPENDENT_AMBULATORY_CARE_PROVIDER_SITE_OTHER): Payer: Medicare PPO

## 2021-04-14 DIAGNOSIS — M25532 Pain in left wrist: Secondary | ICD-10-CM | POA: Diagnosis not present

## 2021-04-14 DIAGNOSIS — M25832 Other specified joint disorders, left wrist: Secondary | ICD-10-CM | POA: Diagnosis not present

## 2021-04-14 DIAGNOSIS — M24132 Other articular cartilage disorders, left wrist: Secondary | ICD-10-CM

## 2021-04-14 NOTE — Progress Notes (Signed)
Office Visit Note   Patient: Amanda Vang           Date of Birth: April 28, 1951           MRN: [id number] Visit Date: 04/14/2021              Requested by: Benita Stabile, MD [address] [place],  [place] [address] PCP: Benita Stabile, MD   Assessment & Plan: Visit Diagnoses:  1. Pain in left wrist   2. Ulnocarpal abutment syndrome, left   3. Degenerative TFCC tear, left     Plan: Discussed the nature of ulnocarpal abutment syndrome and the effect of ulnar positivity on the wrist.  We discussed the TFCC and her likely degenerative tearing.  We discussed surgical treatment options for ulnocarpal abutment and TFCC injury including wrist arthroscopy with debridement and ulnar shortening osteotomy.  At this point she does not want to pursue operative management and would rather continue to treat this conservatively with activity modification, NSAIDs, and bracing.  She will follow-up as needed if her symptoms worsen or she wishes to discuss surgery again.  Follow-Up Instructions: Return if symptoms worsen or fail to improve.   Orders:  Orders Placed This Encounter  Procedures   XR Wrist 2 Views Left   No orders of the defined types were placed in this encounter.     Procedures: No procedures performed   Clinical Data: No additional findings.   Subjective: Chief Complaint  Patient presents with   Left Wrist - Pain    This is a 70 year old right-hand-dominant female who presents with control and ulnar-sided left wrist pain.  Her symptoms started and May of this year.  She was playing golf when she heard a pop during her follow-through.  She has since developed pain in both the central and ulnar aspects of her wrist.  The pain is inhibiting her activity.  She is an avid golfer and is not able to play as much as she would like she placed twice a week instead of 3 times a week.  She takes meloxicam which helps some with the pain.  She also wears a brace which helps.  Her pain  is 5/10 at worst.  She denies any clicking or popping in her wrist.  She denies any prior injury to her wrist.   Review of Systems  Constitutional: Negative.   Respiratory: Negative.    Cardiovascular: Negative.   Skin: Negative.   Neurological: Negative.   Psychiatric/Behavioral: Negative.      Objective: Vital Signs: There were no vitals taken for this visit.  Physical Exam Constitutional:      Appearance: She is normal weight.  Cardiovascular:     Rate and Rhythm: Normal rate.     Pulses: Normal pulses.  Pulmonary:     Effort: Pulmonary effort is normal.  Skin:    General: Skin is warm and dry.     Capillary Refill: Capillary refill takes less than 2 seconds.  Neurological:     General: No focal deficit present.     Mental Status: She is alert.    Left Hand Exam   Tenderness  Left hand tenderness location: TTP at midline of wrist around lunate/LT ligament and around fovea.    Left  Wrist Exam  Wrist ROM (ext/flex): 60/50 Forearm prosupination: Full  There is tenderness over the:  LT interval  Fovea   Provocative Tests Lourena Simmonds: Neg Watson shift test: Neg Ulnocarpal grind: Neg ECU synergy:  Mildly positive ECU subluxation: Neg LT shuck: Neg Pisotriquetral compression: Neg DRUJ ballottement: Neg   Specialty Comments:  No specialty comments available.  Imaging: Prior 3 views of the left wrist reviewed by me.  They demonstrate ulnar positive variance, however, is on clear whether this is a 0 rotation view.  There also appears to be a defect at the ulnar aspect of the proximal lunate  Prior MRI of this left wrist also reviewed by me demonstrates a degenerative tear of the TFCC at the central portion.  There is a osteochondral defect of the lunate at the proximal ulnar portion in the area of expected ulnocarpal findings.   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   History of retinal detachment 03/20/2020   Chest pain 09/28/2011   Past Medical History:  Diagnosis Date   Medical history non-contributory    No pertinent past medical history     Family History  Problem Relation Age of Onset   Anesthesia problems Neg Hx    Hypotension Neg Hx    Malignant hyperthermia Neg Hx    Pseudochol deficiency Neg Hx     Past Surgical History:  Procedure Laterality Date   CATARACT EXTRACTION W/PHACO  04/05/2011   Procedure: CATARACT EXTRACTION PHACO AND INTRAOCULAR LENS PLACEMENT (IOC);  Surgeon: Gemma Payor;  Location: [facility];  Service: Ophthalmology;  Laterality: Left;  CDE 11.84   CATARACT EXTRACTION W/PHACO  09/04/2012   Procedure: CATARACT EXTRACTION PHACO AND INTRAOCULAR LENS PLACEMENT (IOC);  Surgeon: Gemma Payor, MD;  Location: [facility];  Service: Ophthalmology;  Laterality: Right;  CDE=14.70   colonoscopy  12/25/10   normal   GAS INSERTION Left 12/12/2014   Procedure: INSERTION OF GAS;  Surgeon: Edmon Crape, MD;  Location: [facility] OR;  Service: Ophthalmology;  Laterality: Left;   PARS PLANA VITRECTOMY Left 12/12/2014   Procedure: PARS PLANA VITRECTOMY WITH 25 GAUGE with endolaser ;  Surgeon: Edmon Crape, MD;  Location: [facility] OR;  Service: Ophthalmology;  Laterality: Left;  add on 5pm   TUBAL LIGATION     Social History   Occupational History   Not on file  Tobacco Use   Smoking status: Former    Packs/day: 0.25    Years: 2.00    Pack years: 0.50    Types: Cigarettes    Quit date: 03/29/1971    Years since quitting: 50.0   Smokeless tobacco: Never  Substance and Sexual Activity   Alcohol use: Yes    Alcohol/week: 7.0 standard drinks    Types: 7 Glasses of wine per week   Drug use: No   Sexual activity: Yes    Birth control/protection: None

## 2021-04-21 DIAGNOSIS — J0100 Acute maxillary sinusitis, unspecified: Secondary | ICD-10-CM | POA: Diagnosis not present

## 2021-09-04 DIAGNOSIS — R21 Rash and other nonspecific skin eruption: Secondary | ICD-10-CM | POA: Diagnosis not present

## 2021-12-31 ENCOUNTER — Encounter: Payer: Self-pay | Admitting: Emergency Medicine

## 2021-12-31 ENCOUNTER — Ambulatory Visit
Admission: EM | Admit: 2021-12-31 | Discharge: 2021-12-31 | Disposition: A | Discharge status: Home / Self Care | Payer: Medicare PPO | Attending: Nurse Practitioner | Admitting: Nurse Practitioner

## 2021-12-31 DIAGNOSIS — R399 Unspecified symptoms and signs involving the genitourinary system: Secondary | ICD-10-CM | POA: Diagnosis not present

## 2021-12-31 LAB — POCT URINALYSIS DIP (MANUAL ENTRY)
Bilirubin, UA: NEGATIVE
Glucose, UA: NEGATIVE mg/dL
Ketones, POC UA: NEGATIVE mg/dL
Nitrite, UA: NEGATIVE
Protein Ur, POC: NEGATIVE mg/dL
Spec Grav, UA: 1.01 (ref 1.010–1.025)
Urobilinogen, UA: 0.2 E.U./dL
pH, UA: 6 (ref 5.0–8.0)

## 2021-12-31 MED ORDER — AMOXICILLIN-POT CLAVULANATE 875-125 MG PO TABS: 1.0000 | ORAL_TABLET | Freq: Two times a day (BID) | ORAL | 0 refills | Status: DC

## 2021-12-31 NOTE — Discharge Instructions (Addendum)
Take medication as prescribed. Your urinalysis does show trace leukocytes.  We have also sent a urine culture for confirmatory testing.  If your results are negative on the culture, you will be contacted and asked to stop the medication. Increase fluids.  You should be drinking at least 6-8 8 ounce glasses of water daily. Void every 2 hours while symptoms persist. If you are sexually active, you should be voiding a approximately 15 to 20 minutes after sexual intercourse. May take over-the-counter ibuprofen or Tylenol as needed for pain or fever. Follow-up immediately if you develop fever, chills, abdominal pain, low back pain, or other concerns.

## 2021-12-31 NOTE — ED Provider Notes (Signed)
[facility]    CSN: [id number] Arrival date & time: 12/31/21  1316      History   Chief Complaint No chief complaint on file.   HPI Amanda Vang is a 71 y.o. female.   The patient is a 71 year old female who presents with urinary symptoms.  Symptoms started approximately 3 days ago.  Patient complains of dysuria, pelvic pressure with urination, and urgency/frequency.  She denies abdominal pain, nausea, vomiting, fever, chills, or low back pain.  Patient states her last UTI was several years ago.  She states that she used to do home test, 1 showed leukocytes, the other showed nitrates and leukocytes.  She states that she has increased her urinary intake and has been drinking cranberry juice.  The history is provided by the patient.   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   History of retinal detachment 03/20/2020   Chest pain 09/28/2011    Past Surgical History:  Procedure Laterality Date   CATARACT EXTRACTION W/PHACO  04/05/2011   Procedure: CATARACT EXTRACTION PHACO AND INTRAOCULAR LENS PLACEMENT ([place]);  Surgeon: Tonny Branch;  Location: [facility];  Service: Ophthalmology;  Laterality: Left;  CDE 11.84   CATARACT EXTRACTION W/PHACO  09/04/2012   Procedure: CATARACT EXTRACTION PHACO AND INTRAOCULAR LENS PLACEMENT ([facility]);  Surgeon: Tonny Branch, MD;  Location: [facility];  Service: Ophthalmology;  Laterality: Right;  CDE=14.70   colonoscopy  12/25/10   normal   GAS INSERTION Left 12/12/2014   Procedure: INSERTION OF GAS;  Surgeon: Hurman Horn, MD;  Location: [place];  Service: Ophthalmology;  Laterality: Left;   PARS PLANA  VITRECTOMY Left 12/12/2014   Procedure: PARS PLANA VITRECTOMY WITH 25 GAUGE with endolaser ;  Surgeon: Hurman Horn, MD;  Location: [place];  Service: Ophthalmology;  Laterality: Left;  add on 5pm   TUBAL LIGATION      OB History   No obstetric history on file.      Home Medications    Prior to Admission medications   Medication Sig Start Date End Date Taking? Authorizing Provider  amoxicillin-clavulanate (AUGMENTIN) 875-125 MG tablet Take 1 tablet by mouth every 12 (twelve) hours. 12/31/21  Yes Alaila Pillard-Warren, Alda Lea, NP  Calcium Carbonate-Vitamin D (CALCIUM 600+D) 600-400 MG-UNIT per tablet Take 1 tablet by mouth 2 (two) times daily.      [provider]  DENAVIR 1 % cream Apply 1 application topically as needed. Fever Blister 09/24/11   [provider]  Estradiol (VAGIFEM) 10 MCG TABS Place 10 mcg vaginally 2 (two) times a week. Nancy Fetter and [facility], Historical, MD  ibuprofen (ADVIL,MOTRIN) 200 MG tablet Take 200 mg by mouth every 6 (six) hours as needed. Pain     [provider]  loratadine (CLARITIN) 10 MG tablet Take 10 mg by mouth daily as needed for allergies.     [provider]  meloxicam (MOBIC) 15 MG tablet Take 15 mg by mouth daily.    [provider]  Multiple Vitamin (MULTIVITAMIN WITH MINERALS) TABS Take 1 tablet by mouth daily.    [provider]  Family History Family History  Problem Relation Age of Onset   Anesthesia problems Neg Hx    Hypotension Neg Hx    Malignant hyperthermia Neg Hx    Pseudochol deficiency Neg Hx     Social History Social History   Tobacco Use   Smoking status: Former    Packs/day: 0.25    Years: 2.00    Pack years: 0.50    Types: Cigarettes    Quit date: 03/29/1971    Years since quitting: 50.7   Smokeless tobacco: Never  Substance Use Topics   Alcohol use: Yes    Alcohol/week: 7.0 standard drinks    Types: 7 Glasses of wine per week   Drug use: No     Allergies    Patient has no known allergies.   Review of Systems Review of Systems PER HPI  Physical Exam Triage Vital Signs ED Triage Vitals  Enc Vitals Group     BP 12/31/21 1438 (!) 145/87     Pulse Rate 12/31/21 1438 64     Resp 12/31/21 1438 18     Temp 12/31/21 1438 97.7 F (36.5 C)     Temp Source 12/31/21 1438 Oral     SpO2 12/31/21 1438 99 %     Weight --      Height --      Head Circumference --      Peak Flow --      Pain Score 12/31/21 1439 5     Pain Loc --      Pain Edu? --      Excl. in [place]? --    No data found.  Updated Vital Signs BP (!) 145/87 (BP Location: Right Arm)   Pulse 64   Temp 97.7 F (36.5 C) (Oral)   Resp 18   SpO2 99%   Visual Acuity Right Eye Distance:   Left Eye Distance:   Bilateral Distance:    Right Eye Near:   Left Eye Near:    Bilateral Near:     Physical Exam Vitals and nursing note reviewed.  Constitutional:      General: She is not in acute distress.    Appearance: Normal appearance. She is well-developed.  Eyes:     Extraocular Movements: Extraocular movements intact.     Pupils: Pupils are equal, round, and reactive to light.  Cardiovascular:     Rate and Rhythm: Normal rate and regular rhythm.     Heart sounds: Normal heart sounds.  Pulmonary:     Effort: Pulmonary effort is normal.     Breath sounds: Normal breath sounds.  Abdominal:     General: Bowel sounds are normal. There is no distension.     Palpations: Abdomen is soft.     Tenderness: There is no abdominal tenderness. There is no right CVA tenderness, left CVA tenderness, guarding or rebound.  Genitourinary:    Vagina: Normal. No vaginal discharge.  Skin:    General: Skin is warm and dry.     Findings: No erythema or rash.  Neurological:     General: No focal deficit present.     Mental Status: She is alert and oriented to person, place, and time.     Cranial Nerves: No cranial nerve deficit.  Psychiatric:        Mood and Affect: Mood normal.         Behavior: Behavior normal.     UC Treatments / Results  Labs (all labs ordered are listed, but  only abnormal results are displayed) Labs Reviewed  POCT URINALYSIS DIP (MANUAL ENTRY) - Abnormal; Notable for the following components:      Result Value   Blood, UA trace-lysed (*)    Leukocytes, UA Trace (*)    All other components within normal limits    EKG   Radiology No results found.  Procedures Procedures (including critical care time)  Medications Ordered in UC Medications - No data to display  Initial Impression / Assessment and Plan / UC Course  I have reviewed the triage vital signs and the nursing notes.  Pertinent labs & imaging results that were available during my care of the patient were reviewed by me and considered in my medical decision making (see chart for details).  The patient is a 71 year old female who presents with urinary symptoms.  Symptoms include urgency/frequency, dysuria, and with urination.  Her urinalysis is positive for trace leukocytes.  A urine culture is pending.  We will treat patient until the culture returns.  Patient's exam and vital signs are reassuring.  She is in no acute distress.  There is no concern for pyelonephritis at this time.  Supportive care was recommended.  Patient advised to follow-up as needed, strict return precautions were also provided. Final Clinical Impressions(s) / UC Diagnoses   Final diagnoses:  Urinary tract infection symptoms     Discharge Instructions      Take medication as prescribed. Your urinalysis does show trace leukocytes.  We have also sent a urine culture for confirmatory testing.  If your results are negative on the culture, you will be contacted and asked to stop the medication. Increase fluids.  You should be drinking at least 6-8 8 ounce glasses of water daily. Void every 2 hours while symptoms persist. If you are sexually active, you should be voiding a approximately 15 to 20 minutes after  sexual intercourse. May take over-the-counter ibuprofen or Tylenol as needed for pain or fever. Follow-up immediately if you develop fever, chills, abdominal pain, low back pain, or other concerns.     ED Prescriptions     Medication Sig Dispense Auth. Provider   amoxicillin-clavulanate (AUGMENTIN) 875-125 MG tablet Take 1 tablet by mouth every 12 (twelve) hours. 14 tablet Bastion Bolger-Warren, Alda Lea, NP      PDMP not reviewed this encounter.   Tish Men, NP 12/31/21 1456

## 2021-12-31 NOTE — ED Triage Notes (Signed)
Urinary pressure, burning on urination, urinary frequency.  Symptoms x 3 days

## 2022-03-22 ENCOUNTER — Encounter (INDEPENDENT_AMBULATORY_CARE_PROVIDER_SITE_OTHER): Payer: Self-pay

## 2022-03-29 ENCOUNTER — Encounter (INDEPENDENT_AMBULATORY_CARE_PROVIDER_SITE_OTHER): Payer: Medicare PPO | Admitting: Ophthalmology

## 2022-04-06 ENCOUNTER — Encounter (INDEPENDENT_AMBULATORY_CARE_PROVIDER_SITE_OTHER): Payer: Self-pay | Admitting: Ophthalmology

## 2022-04-06 ENCOUNTER — Ambulatory Visit (INDEPENDENT_AMBULATORY_CARE_PROVIDER_SITE_OTHER): Payer: Medicare PPO | Admitting: Ophthalmology

## 2022-04-06 DIAGNOSIS — Z961 Presence of intraocular lens: Secondary | ICD-10-CM | POA: Diagnosis not present

## 2022-04-06 DIAGNOSIS — Z8669 Personal history of other diseases of the nervous system and sense organs: Secondary | ICD-10-CM | POA: Diagnosis not present

## 2022-04-06 DIAGNOSIS — H43811 Vitreous degeneration, right eye: Secondary | ICD-10-CM

## 2022-04-06 NOTE — Assessment & Plan Note (Signed)
OS doing well.  Retina attached.  Good acuity

## 2022-04-06 NOTE — Assessment & Plan Note (Signed)
No holes or tears 

## 2022-04-06 NOTE — Assessment & Plan Note (Signed)
STABLE

## 2022-04-06 NOTE — Progress Notes (Signed)
04/06/2022     CHIEF COMPLAINT Patient presents for  Chief Complaint  Patient presents with   Posterior Vitreous Detachment      HISTORY OF PRESENT ILLNESS: Amanda Vang is a 71 y.o. female who presents to the clinic today for:   HPI   1 YR FU OU OCT FP. Pt stated, "Ive noticed slight vision changes, I have to wear my glasses more often to read. A couple of weeks ago, I noticed a bent line that's almost shaped like a "Z" or a broken line but I haven't seen it since then."  Last edited by Angeline Slim on 04/06/2022  8:37 AM.      Referring physician: Benita Stabile, MD [address] [place],  [place] [address]  HISTORICAL INFORMATION:   Selected notes from the MEDICAL RECORD NUMBER       CURRENT MEDICATIONS: No current outpatient medications on file. (Ophthalmic Drugs)   No current facility-administered medications for this visit. (Ophthalmic Drugs)   Current Outpatient Medications (Other)  Medication Sig   amoxicillin-clavulanate (AUGMENTIN) 875-125 MG tablet Take 1 tablet by mouth every 12 (twelve) hours.   Calcium Carbonate-Vitamin D (CALCIUM 600+D) 600-400 MG-UNIT per tablet Take 1 tablet by mouth 2 (two) times daily.     DENAVIR 1 % cream Apply 1 application topically as needed. Fever Blister   Estradiol (VAGIFEM) 10 MCG TABS Place 10 mcg vaginally 2 (two) times a week. Wynelle Link and Thurs   ibuprofen (ADVIL,MOTRIN) 200 MG tablet Take 200 mg by mouth every 6 (six) hours as needed. Pain    loratadine (CLARITIN) 10 MG tablet Take 10 mg by mouth daily as needed for allergies.    meloxicam (MOBIC) 15 MG tablet Take 15 mg by mouth daily.   Multiple Vitamin (MULTIVITAMIN WITH MINERALS) TABS Take 1 tablet by mouth daily.   No current facility-administered medications for this visit. (Other)      REVIEW OF SYSTEMS: ROS   Negative for: Constitutional, Gastrointestinal, Neurological, Skin, Genitourinary, Musculoskeletal, HENT, Endocrine, Cardiovascular, Eyes, Respiratory,  Psychiatric, Allergic/Imm, Heme/Lymph Last edited by Angeline Slim on 04/06/2022  8:37 AM.       ALLERGIES No Known Allergies  PAST MEDICAL HISTORY Past Medical History:  Diagnosis Date   Medical history non-contributory    No pertinent past medical history    Past Surgical History:  Procedure Laterality Date   CATARACT EXTRACTION W/PHACO  04/05/2011   Procedure: CATARACT EXTRACTION PHACO AND INTRAOCULAR LENS PLACEMENT ([facility]);  Surgeon: Gemma Payor;  Location: [facility];  Service: Ophthalmology;  Laterality: Left;  CDE 11.84   CATARACT EXTRACTION W/PHACO  09/04/2012   Procedure: CATARACT EXTRACTION PHACO AND INTRAOCULAR LENS PLACEMENT ([facility]);  Surgeon: Gemma Payor, MD;  Location: [facility];  Service: Ophthalmology;  Laterality: Right;  CDE=14.70   colonoscopy  12/25/10   normal   GAS INSERTION Left 12/12/2014   Procedure: INSERTION OF GAS;  Surgeon: Edmon Crape, MD;  Location: [facility];  Service: Ophthalmology;  Laterality: Left;   PARS PLANA VITRECTOMY Left 12/12/2014   Procedure: PARS PLANA VITRECTOMY WITH 25 GAUGE with endolaser ;  Surgeon: Edmon Crape, MD;  Location: [facility];  Service: Ophthalmology;  Laterality: Left;  add on 5pm   TUBAL LIGATION      FAMILY HISTORY Family History  Problem Relation Age of Onset   Anesthesia problems Neg Hx    Hypotension Neg Hx    Malignant hyperthermia Neg Hx    Pseudochol deficiency Neg Hx  SOCIAL HISTORY Social History   Tobacco Use   Smoking status: Former    Packs/day: 0.25    Years: 2.00    Total pack years: 0.50    Types: Cigarettes    Quit date: 03/29/1971    Years since quitting: 51.0   Smokeless tobacco: Never  Substance Use Topics   Alcohol use: Yes    Alcohol/week: 7.0 standard drinks of alcohol    Types: 7 Glasses of wine per week   Drug use: No         OPHTHALMIC EXAM:  Base Eye Exam     Visual Acuity (ETDRS)       Right Left   Dist [place] 20/50 -2 20/30 -1   Dist ph  20/25 -2 20/25 -1         Tonometry (Tonopen,  8:42 AM)       Right Left   Pressure 15 14         Pupils       Pupils APD   Right PERRL None   Left PERRL None         Visual Fields       Left Right    Full Full         Extraocular Movement       Right Left    Full, Ortho Full, Ortho         Neuro/Psych     Oriented x3: Yes   Mood/Affect: Normal         Dilation     Both eyes: 1.0% Mydriacyl, 2.5% Phenylephrine @ 8:42 AM           Slit Lamp and Fundus Exam     External Exam       Right Left   External Normal Normal         Slit Lamp Exam       Right Left   Lids/Lashes Normal Normal   Conjunctiva/Sclera White and quiet White and quiet   Cornea Clear Clear   Anterior Chamber Deep and quiet Deep and quiet   Iris Round and reactive Round and reactive   Lens Centered posterior chamber intraocular lens Centered posterior chamber intraocular lens   Anterior Vitreous Normal Normal         Fundus Exam       Right Left   Posterior Vitreous Posterior vitreous detachment, Central vitreous floaters Clear vitrectomized   Disc Normal Normal   C/D Ratio 0.4 0.55   Macula Normal Normal   Vessels Normal Normal   Periphery Normal, no retinal breaks Good retinopexy inferiorly and prior retinotomy superior nasal to nerve            IMAGING AND PROCEDURES  Imaging and Procedures for 04/06/22  OCT, Retina - OU - Both Eyes       Right Eye Quality was good. Scan locations included subfoveal. Central Foveal Thickness: 219. Progression has been stable. Findings include normal foveal contour.   Left Eye Quality was good. Scan locations included subfoveal. Central Foveal Thickness: 222. Progression has been stable. Findings include abnormal foveal contour.   Notes Irregular macular contour in an eye with post retinal detachment and reattachment changes OS no change  OD, PVD     Color Fundus Photography Optos - OU - Both Eyes       Right Eye Progression has been stable. Disc  findings include normal observations. Macula : normal observations. Vessels : normal observations. Periphery : normal observations.  Left Eye Progression has been stable. Disc findings include normal observations. Vessels : normal observations.   Notes OD with posterior vitreous detachment and vitreous debris, OS, status post retinal detach repair 6 years previous, no changes             ASSESSMENT/PLAN:  History of retinal detachment OS doing well.  Retina attached.  Good acuity  Posterior vitreous detachment of right eye No holes or tears  Pseudophakia of both eyes STABLE     ICD-10-CM   1. Posterior vitreous detachment of right eye  H43.811 OCT, Retina - OU - Both Eyes    Color Fundus Photography Optos - OU - Both Eyes    2. History of retinal detachment  Z86.69     3. Pseudophakia of both eyes  Z96.1       1.  OU doing very well.  No signs of holes tears or detachment recurrence in the left eye and no primary detachments in the right eye.  2.  3.  Ophthalmic Meds Ordered this visit:  No orders of the defined types were placed in this encounter.      Return in about 2 years (around 04/06/2024) for DILATE OU, COLOR FP, OCT.  There are no Patient Instructions on file for this visit.   Explained the diagnoses, plan, and follow up with the patient and they expressed understanding.  Patient expressed understanding of the importance of proper follow up care.   Clent Demark Nellie Chevalier M.D. Diseases & Surgery of the Retina and Vitreous Retina & Diabetic [place] 04/06/22     Abbreviations: M myopia (nearsighted); A astigmatism; H hyperopia (farsighted); P presbyopia; Mrx spectacle prescription;  CTL contact lenses; OD right eye; OS left eye; OU both eyes  XT exotropia; ET esotropia; PEK punctate epithelial keratitis; PEE punctate epithelial erosions; DES dry eye syndrome; MGD meibomian gland dysfunction; ATs artificial tears; PFAT's preservative free artificial  tears; [place] nuclear sclerotic cataract; PSC posterior subcapsular cataract; ERM epi-retinal membrane; PVD posterior vitreous detachment; RD retinal detachment; DM diabetes mellitus; DR diabetic retinopathy; NPDR non-proliferative diabetic retinopathy; PDR proliferative diabetic retinopathy; CSME clinically significant macular edema; DME diabetic macular edema; dbh dot blot hemorrhages; CWS cotton wool spot; POAG primary open angle glaucoma; C/D cup-to-disc ratio; HVF humphrey visual field; GVF goldmann visual field; OCT optical coherence tomography; IOP intraocular pressure; BRVO Branch retinal vein occlusion; CRVO central retinal vein occlusion; CRAO central retinal artery occlusion; BRAO branch retinal artery occlusion; RT retinal tear; SB scleral buckle; PPV pars plana vitrectomy; VH Vitreous hemorrhage; PRP panretinal laser photocoagulation; IVK intravitreal kenalog; VMT vitreomacular traction; MH Macular hole;  NVD neovascularization of the disc; NVE neovascularization elsewhere; AREDS age related eye disease study; ARMD age related macular degeneration; POAG primary open angle glaucoma; EBMD epithelial/anterior basement membrane dystrophy; ACIOL anterior chamber intraocular lens; IOL intraocular lens; PCIOL posterior chamber intraocular lens; Phaco/IOL phacoemulsification with intraocular lens placement; [place] photorefractive keratectomy; LASIK laser assisted in situ keratomileusis; HTN hypertension; DM diabetes mellitus; COPD chronic obstructive pulmonary disease

## 2022-09-02 ENCOUNTER — Other Ambulatory Visit: Payer: Self-pay | Admitting: Adult Health Nurse Practitioner

## 2022-09-02 ENCOUNTER — Other Ambulatory Visit (HOSPITAL_COMMUNITY): Payer: Self-pay | Admitting: Family Medicine

## 2022-09-02 DIAGNOSIS — I7121 Aneurysm of the ascending aorta, without rupture: Secondary | ICD-10-CM

## 2022-09-17 ENCOUNTER — Emergency Department (HOSPITAL_COMMUNITY): Payer: Medicare PPO

## 2022-09-17 ENCOUNTER — Emergency Department (HOSPITAL_COMMUNITY)
Admission: EM | Admit: 2022-09-17 | Discharge: 2022-09-17 | Disposition: A | Discharge status: Home / Self Care | Payer: Medicare PPO | Attending: Emergency Medicine | Admitting: Emergency Medicine

## 2022-09-17 ENCOUNTER — Other Ambulatory Visit: Payer: Self-pay

## 2022-09-17 DIAGNOSIS — R079 Chest pain, unspecified: Secondary | ICD-10-CM | POA: Insufficient documentation

## 2022-09-17 DIAGNOSIS — R0789 Other chest pain: Secondary | ICD-10-CM | POA: Diagnosis present

## 2022-09-17 LAB — CBC WITH DIFFERENTIAL/PLATELET
Abs Immature Granulocytes: 0.03 10*3/uL (ref 0.00–0.07)
Basophils Absolute: 0 10*3/uL (ref 0.0–0.1)
Basophils Relative: 1 %
Eosinophils Absolute: 0.1 10*3/uL (ref 0.0–0.5)
Eosinophils Relative: 1 %
HCT: 45.2 % (ref 36.0–46.0)
Hemoglobin: 14.9 g/dL (ref 12.0–15.0)
Immature Granulocytes: 0 %
Lymphocytes Relative: 24 %
Lymphs Abs: 1.9 10*3/uL (ref 0.7–4.0)
MCH: 28.4 pg (ref 26.0–34.0)
MCHC: 33 g/dL (ref 30.0–36.0)
MCV: 86.3 fL (ref 80.0–100.0)
Monocytes Absolute: 0.5 10*3/uL (ref 0.1–1.0)
Monocytes Relative: 7 %
Neutro Abs: 5.3 10*3/uL (ref 1.7–7.7)
Neutrophils Relative %: 67 %
Platelets: 210 10*3/uL (ref 150–400)
RBC: 5.24 MIL/uL — ABNORMAL HIGH (ref 3.87–5.11)
RDW: 13 % (ref 11.5–15.5)
WBC: 7.9 10*3/uL (ref 4.0–10.5)
nRBC: 0 % (ref 0.0–0.2)

## 2022-09-17 LAB — TROPONIN I (HIGH SENSITIVITY)
Troponin I (High Sensitivity): 4 ng/L (ref <18)
Troponin I (High Sensitivity): 3 ng/L (ref <18)

## 2022-09-17 LAB — COMPREHENSIVE METABOLIC PANEL
ALT: 15 U/L (ref 0–44)
AST: 27 U/L (ref 15–41)
Albumin: 3.9 g/dL (ref 3.5–5.0)
Alkaline Phosphatase: 66 U/L (ref 38–126)
Anion gap: 9 (ref 5–15)
BUN: 13 mg/dL (ref 8–23)
CO2: 28 mmol/L (ref 22–32)
Calcium: 9.3 mg/dL (ref 8.9–10.3)
Chloride: 100 mmol/L (ref 98–111)
Creatinine, Ser: 0.93 mg/dL (ref 0.44–1.00)
GFR, Estimated: >60 mL/min (ref >60)
Glucose, Bld: 104 mg/dL — ABNORMAL HIGH (ref 70–99)
Potassium: 4.1 mmol/L (ref 3.5–5.1)
Sodium: 137 mmol/L (ref 135–145)
Total Bilirubin: 1.1 mg/dL (ref 0.3–1.2)
Total Protein: 6.7 g/dL (ref 6.5–8.1)

## 2022-09-17 LAB — LIPASE, BLOOD: Lipase: 42 U/L (ref 11–51)

## 2022-09-17 LAB — CBG MONITORING, ED: Glucose-Capillary: 92 mg/dL (ref 70–99)

## 2022-09-17 MED ORDER — NITROGLYCERIN 0.4 MG SL SUBL
0.4000 mg | SUBLINGUAL_TABLET | SUBLINGUAL | Status: DC | PRN
  Administered 2022-09-17: 0.4 mg via SUBLINGUAL
  Filled 2022-09-17: qty 1

## 2022-09-17 MED ORDER — IOHEXOL 350 MG/ML SOLN
100.0000 mL | Freq: Once | INTRAVENOUS | Status: AC | PRN
  Administered 2022-09-17: 100 mL via INTRAVENOUS

## 2022-09-17 NOTE — ED Provider Notes (Signed)
[facility] Provider Note   CSN: [id number] Arrival date & time: 09/17/22  [id number]     History  Chief Complaint  Patient presents with   Chest Pain   Arm Pain   Emesis   Nausea   Shortness of Breath    Amanda Vang is a 72 y.o. female.  73 yo F with a chief complaint of chest discomfort.  This been off and on for the past 4 to 5 days.  Seems to occur at rest primarily.  She gets some left-sided pressure and then has felt nauseated and has felt like she needed to vomit.  She does get short of breath with it.  Has had some radiation into the left arm.  Has a history of thoracic aortic aneurysm.  Has been lost to follow-up and has not had this reimaged in a while.  She denies trauma.  Is getting over an upper respiratory illness about a month ago and thinks she is completely better from that.  She denies any worsening with eating.  Patient denies history of MI, denies hypertension hyperlipidemia diabetes or smoking.  Denies family history of MI.  Patient denies history of PE or DVT denies hemoptysis denies unilateral lower extremity edema denies recent surgery immobilization hospitalization estrogen use or history of cancer.     Chest Pain Associated symptoms: shortness of breath and vomiting   Arm Pain Associated symptoms include chest pain and shortness of breath.  Emesis Shortness of Breath Associated symptoms: chest pain and vomiting        Home Medications Prior to Admission medications   Medication Sig Start Date End Date Taking? Authorizing Provider  amoxicillin-clavulanate (AUGMENTIN) 875-125 MG tablet Take 1 tablet by mouth every 12 (twelve) hours. 12/31/21   Leath-Warren, Alda Lea, NP  Calcium Carbonate-Vitamin D (CALCIUM 600+D) 600-400 MG-UNIT per tablet Take 1 tablet by mouth 2 (two) times daily.      [provider]  DENAVIR 1 % cream Apply 1 application topically as needed. Fever Blister 09/24/11   [provider]  Estradiol (VAGIFEM) 10 MCG TABS Place 10 mcg vaginally 2 (two) times a week. Nancy Fetter and [facility], Historical, MD  ibuprofen (ADVIL,MOTRIN) 200 MG tablet Take 200 mg by mouth every 6 (six) hours as needed. Pain     [provider]  loratadine (CLARITIN) 10 MG tablet Take 10 mg by mouth daily as needed for allergies.     [provider]  meloxicam (MOBIC) 15 MG tablet Take 15 mg by mouth daily.    [provider]  Multiple Vitamin (MULTIVITAMIN WITH MINERALS) TABS Take 1 tablet by mouth daily.    [provider]      Allergies    Patient has no known allergies.    Review of Systems   Review of Systems  Respiratory:  Positive for shortness of breath.   Cardiovascular:  Positive for chest pain.  Gastrointestinal:  Positive for vomiting.    Physical Exam Updated Vital Signs BP (!) 151/82   Pulse 66   Temp (!) 97.2 F (36.2 C)   Resp (!) 29   Ht 5' 2"$  (1.575 m)   Wt 57.2 kg   SpO2 100%   BMI 23.05 kg/m  Physical Exam Vitals and nursing note reviewed.  Constitutional:      General: She is not in acute distress.    Appearance: She is well-developed. She is not diaphoretic.  HENT:  Head: Normocephalic and atraumatic.  Eyes:     Pupils: Pupils are equal, round, and reactive to light.  Cardiovascular:     Rate and Rhythm: Normal rate and regular rhythm.     Heart sounds: No murmur heard.    No friction rub. No gallop.  Pulmonary:     Effort: Pulmonary effort is normal.     Breath sounds: No wheezing or rales.  Chest:     Comments: Grimaces slightly but denies any pain with palpation of the chest Abdominal:     General: There is no distension.     Palpations: Abdomen is soft.     Tenderness: There is no abdominal tenderness.  Musculoskeletal:        General: No tenderness.     Cervical back: Normal range of motion and neck supple.  Skin:    General: Skin is warm and dry.  Neurological:     Mental Status: She is  alert and oriented to person, place, and time.  Psychiatric:        Behavior: Behavior normal.     ED Results / Procedures / Treatments   Labs (all labs ordered are listed, but only abnormal results are displayed) Labs Reviewed  COMPREHENSIVE METABOLIC PANEL - Abnormal; Notable for the following components:      Result Value   Glucose, Bld 104 (*)    All other components within normal limits  CBC WITH DIFFERENTIAL/PLATELET - Abnormal; Notable for the following components:   RBC 5.24 (*)    All other components within normal limits  LIPASE, BLOOD  CBG MONITORING, ED  TROPONIN I (HIGH SENSITIVITY)  TROPONIN I (HIGH SENSITIVITY)    EKG EKG Interpretation  Date/Time:  Friday September 17 2022 09:43:01 EST Ventricular Rate:  75 PR Interval:  113 QRS Duration: 84 QT Interval:  400 QTC Calculation: 447 R Axis:   -77 Text Interpretation: Sinus rhythm Borderline short PR interval Left anterior fascicular block RSR' in V1 or V2, right VCD or RVH No significant change since last tracing Confirmed by Deno Etienne [phone]) on 09/17/2022 9:56:17 AM  Radiology CT Angio Chest/Abd/Pel for Dissection W and/or Wo Contrast  Result Date: 09/17/2022 CLINICAL DATA:  72 year old female with acute chest and abdominal pain with nausea and vomiting for several days. EXAM: CT ANGIOGRAPHY CHEST, ABDOMEN AND PELVIS TECHNIQUE: Non-contrast CT of the chest was initially obtained. Multidetector CT imaging through the chest, abdomen and pelvis was performed using the standard protocol during bolus administration of intravenous contrast. Multiplanar reconstructed images and MIPs were obtained and reviewed to evaluate the vascular anatomy. RADIATION DOSE REDUCTION: This exam was performed according to the departmental dose-optimization program which includes automated exposure control, adjustment of the mA and/or kV according to patient size and/or use of iterative reconstruction technique. CONTRAST:  132m OMNIPAQUE  IOHEXOL 350 MG/ML SOLN COMPARISON:  01/04/2018 abdominal and pelvic CT and 11/22/2017 chest CT FINDINGS: CTA CHEST FINDINGS Cardiovascular: Unchanged fusiform aneurysm of the ascending thoracic aorta is noted measuring up to 4 cm. There is no evidence of thoracic aortic dissection. Heart size is within normal limits. There is no evidence of pericardial effusion. Mediastinum/Nodes: Subcentimeter LEFT thyroid nodules are again identified. No enlarged lymph nodes are identified. The visualized trachea and esophagus are unremarkable. Lungs/Pleura: There is no evidence airspace disease, consolidation, mass, suspicious nodule, pleural effusion or pneumothorax. Musculoskeletal: No acute or suspicious bony abnormalities are noted. Review of the MIP images confirms the above findings. CTA ABDOMEN AND PELVIS FINDINGS VASCULAR Aorta: Normal  caliber aorta without aneurysm, dissection, vasculitis or significant stenosis. Aortic atherosclerotic calcifications are present. Celiac: Patent without evidence of aneurysm, dissection, vasculitis or significant stenosis. SMA: Patent without evidence of aneurysm, dissection, vasculitis or significant stenosis. Renals: Both renal arteries are patent without evidence of aneurysm, dissection, vasculitis, fibromuscular dysplasia or significant stenosis. IMA: Patent without evidence of aneurysm, dissection, vasculitis or significant stenosis. Inflow: Patent without evidence of aneurysm, dissection, vasculitis or significant stenosis. Veins: No obvious venous abnormality within the limitations of this arterial phase study. Review of the MIP images confirms the above findings. NON-VASCULAR Hepatobiliary: No significant liver or gallbladder abnormalities noted. There is no evidence of intrahepatic or extrahepatic biliary dilatation. Pancreas: Unremarkable Spleen: Unremarkable Adrenals/Urinary Tract: Renal cysts are present for which no follow-up imaging is recommended. The kidneys, adrenal glands  and bladder are otherwise unremarkable. There is no evidence of hydronephrosis or urinary calculi. Stomach/Bowel: Stomach is within normal limits. Appendix appears normal. No evidence of bowel wall thickening, distention, or inflammatory changes. Colonic diverticulosis identified without evidence of acute diverticulitis. Lymphatic: No enlarged lymph nodes are identified. Reproductive: Uterus and bilateral adnexa are unremarkable. Other: No ascites, focal collection or pneumoperitoneum. Musculoskeletal: No acute or suspicious bony abnormalities are noted. Degenerative disc disease at L4-5 and grade 1 spondylolisthesis at L5-S1 again noted. Review of the MIP images confirms the above findings. IMPRESSION: 1. No evidence of acute abnormality. No evidence of aortic dissection. 2. Unchanged 4 cm ascending thoracic aortic aneurysm. Recommend follow-up every 12 months and vascular consultation if not already performed. This recommendation follows ACR consensus guidelines: White Paper of the ACR Incidental Findings Committee II on Vascular Findings. J Am Coll Radiol 2013; 10:789-794. 3. Unchanged subcentimeter LEFT thyroid nodules. Not clinically significant; no follow-up imaging recommended (ref: J Am Coll Radiol. 2015 Feb;12(2): 143-50). 4. Colonic diverticulosis without evidence of acute diverticulitis. 5.  Aortic Atherosclerosis (ICD10-I70.0). Electronically Signed   By: Margarette Canada M.D.   On: 09/17/2022 12:19   DG Chest Portable 1 View  Result Date: 09/17/2022 CLINICAL DATA:  Chest pain EXAM: PORTABLE CHEST 1 VIEW COMPARISON:  Chest x-ray dated Dec 20, 2017 FINDINGS: Cardiac and mediastinal contours are unchanged. Opacity at the right cardiophrenic angle is similar to prior and likely due to prominent pericardial fat. Lungs are clear. No evidence pleural effusion or pneumothorax IMPRESSION: No active disease. Electronically Signed   By: Yetta Glassman M.D.   On: 09/17/2022 10:14    Procedures Procedures     Medications Ordered in ED Medications  nitroGLYCERIN (NITROSTAT) SL tablet 0.4 mg (0.4 mg Sublingual Given 09/17/22 1028)  iohexol (OMNIPAQUE) 350 MG/ML injection 100 mL (100 mLs Intravenous Contrast Given 09/17/22 1151)    ED Course/ Medical Decision Making/ A&P                             Medical Decision Making Amount and/or Complexity of Data Reviewed Labs: ordered. Radiology: ordered.  Risk Prescription drug management.   72 yo F with a chief complaint of chest pain.  She has some typical and some atypical components of this.  Seems to come and go.  Going on for the past 4 to 5 days.  She also has a history of thoracic aortic aneurysm.  On record review the patient had a incidental finding on CT of the chest in 2019 that showed ascending thoracic aorta 4.1 cm.  Per her PCP notes this has not been followed up since.  2 troponins  are negative.  CT angiogram of the chest without change to her thoracic aortic aneurysm.  Patient now is pain-free.  My calculated heart score is a 3.  Will have her follow-up as an outpatient.  Patient requesting cardiology referral.  2:03 PM:  I have discussed the diagnosis/risks/treatment options with the patient and family.  Evaluation and diagnostic testing in the emergency department does not suggest an emergent condition requiring admission or immediate intervention beyond what has been performed at this time.  They will follow up with PCP, cards. We also discussed returning to the ED immediately if new or worsening sx occur. We discussed the sx which are most concerning (e.g., sudden worsening pain, fever, inability to tolerate by mouth) that necessitate immediate return. Medications administered to the patient during their visit and any new prescriptions provided to the patient are listed below.  Medications given during this visit Medications  nitroGLYCERIN (NITROSTAT) SL tablet 0.4 mg (0.4 mg Sublingual Given 09/17/22 1028)  iohexol (OMNIPAQUE) 350  MG/ML injection 100 mL (100 mLs Intravenous Contrast Given 09/17/22 1151)     The patient appears reasonably screen and/or stabilized for discharge and I doubt any other medical condition or other [facility] requiring further screening, evaluation, or treatment in the ED at this time prior to discharge.            Final Clinical Impression(s) / ED Diagnoses Final diagnoses:  Nonspecific chest pain    Rx / DC Orders ED Discharge Orders          Ordered    Ambulatory referral to Cardiology       Comments: If you have not heard from the Cardiology office within the next 72 hours please call [phone].   09/17/22 [place], DO 09/17/22 1403

## 2022-09-17 NOTE — ED Notes (Signed)
ED Provider at bedside. 

## 2022-09-17 NOTE — Discharge Instructions (Signed)
Try pepcid or tagamet up to twice a day.  Try to avoid things that may make this worse, most commonly these are spicy foods tomato based products fatty foods chocolate and peppermint.  Alcohol and tobacco can also make this worse.  Return to the emergency department for sudden worsening pain fever or inability to eat or drink.  

## 2022-09-17 NOTE — ED Triage Notes (Signed)
Pt. Stated, Amanda Vang had chest pain, arm pain, N/V this started on Tuesday, on Wednesday I had left arm pain for about 15 min. All Day on Thursday I was tired all day.

## 2022-09-17 NOTE — ED Notes (Signed)
MD at bedside. 

## 2022-09-19 NOTE — Progress Notes (Unsigned)
No chief complaint on file.     History of Present Illness: 72 yo female with no chronic medical problems who is here today as a new patient for the evaluation of chest pain. She has had chest pain on/off over the past ten years. She was seen remotely by Dr. Acie Fredrickson. She was seen in the ED 2/924 with chest pian which was felt to be atypical for cardiac pain. Troponin negative x 2. She tells me today that she ***  Primary Care Physician: Pablo Lawrence, NP   Past Medical History:  Diagnosis Date   Medical history non-contributory    No pertinent past medical history     Past Surgical History:  Procedure Laterality Date   CATARACT EXTRACTION [facility]  04/05/2011   Procedure: CATARACT EXTRACTION PHACO AND INTRAOCULAR LENS PLACEMENT ([place]);  Surgeon: Tonny Branch;  Location: [facility];  Service: Ophthalmology;  Laterality: Left;  CDE 11.84   CATARACT EXTRACTION W/PHACO  09/04/2012   Procedure: CATARACT EXTRACTION PHACO AND INTRAOCULAR LENS PLACEMENT (IOC);  Surgeon: Tonny Branch, MD;  Location: [facility];  Service: Ophthalmology;  Laterality: Right;  CDE=14.70   colonoscopy  12/25/10   normal   GAS INSERTION Left 12/12/2014   Procedure: INSERTION OF GAS;  Surgeon: Hurman Horn, MD;  Location: [place];  Service: Ophthalmology;  Laterality: Left;   PARS PLANA VITRECTOMY Left 12/12/2014   Procedure: PARS PLANA VITRECTOMY WITH 25 GAUGE with endolaser ;  Surgeon: Hurman Horn, MD;  Location: [place];  Service: Ophthalmology;  Laterality: Left;  add on 5pm   TUBAL LIGATION      Current Outpatient Medications  Medication Sig Dispense Refill   amoxicillin-clavulanate (AUGMENTIN) 875-125 MG tablet Take 1 tablet by mouth every 12 (twelve) hours. 14 tablet 0   Calcium Carbonate-Vitamin D (CALCIUM 600+D) 600-400 MG-UNIT per tablet Take 1 tablet by mouth 2 (two) times daily.       DENAVIR 1 % cream Apply 1 application topically as needed. Fever Blister     Estradiol (VAGIFEM) 10 MCG TABS Place 10 mcg vaginally 2  (two) times a week. Nancy Fetter and Thurs     ibuprofen (ADVIL,MOTRIN) 200 MG tablet Take 200 mg by mouth every 6 (six) hours as needed. Pain      loratadine (CLARITIN) 10 MG tablet Take 10 mg by mouth daily as needed for allergies.      meloxicam (MOBIC) 15 MG tablet Take 15 mg by mouth daily.     Multiple Vitamin (MULTIVITAMIN WITH MINERALS) TABS Take 1 tablet by mouth daily.     No current facility-administered medications for this visit.    No Known Allergies  Social History   Socioeconomic History   Marital status: Widowed    Spouse name: Not on file   Number of children: Not on file   Years of education: Not on file   Highest education level: Not on file  Occupational History   Not on file  Tobacco Use   Smoking status: Former    Packs/day: 0.25    Years: 2.00    Total pack years: 0.50    Types: Cigarettes    Quit date: 03/29/1971    Years since quitting: 51.5   Smokeless tobacco: Never  Substance and Sexual Activity   Alcohol use: Yes    Alcohol/week: 7.0 standard drinks of alcohol    Types: 7 Glasses of wine per week   Drug use: No   Sexual activity: Yes    Birth control/protection: None  Other Topics Concern   Not on file  Social History Narrative   Not on file   Social Determinants of Health   Financial Resource Strain: Not on file  Food Insecurity: Not on file  Transportation Needs: Not on file  Physical Activity: Not on file  Stress: Not on file  Social Connections: Not on file  Intimate Partner Violence: Not on file    Family History  Problem Relation Age of Onset   Anesthesia problems Neg Hx    Hypotension Neg Hx    Malignant hyperthermia Neg Hx    Pseudochol deficiency Neg Hx     Review of Systems:  As stated in the HPI and otherwise negative.   There were no vitals taken for this visit.  Physical Examination: General: Well developed, well nourished, NAD  HEENT: OP clear, mucus membranes moist  SKIN: warm, dry. No rashes. Neuro: No focal  deficits  Musculoskeletal: Muscle strength 5/5 all ext  Psychiatric: Mood and affect normal  Neck: No JVD, no carotid bruits, no thyromegaly, no lymphadenopathy.  Lungs:Clear bilaterally, no wheezes, rhonci, crackles Cardiovascular: Regular rate and rhythm. No murmurs, gallops or rubs. Abdomen:Soft. Bowel sounds present. Non-tender.  Extremities: No lower extremity edema. Pulses are 2 + in the bilateral DP/PT.  EKG:  EKG {ACTION; IS/IS [id number] ordered today. The ekg ordered today demonstrates ***  Recent Labs: 09/17/2022: ALT 15; BUN 13; Creatinine, Ser 0.93; Hemoglobin 14.9; Platelets 210; Potassium 4.1; Sodium 137   Lipid Panel No results found for: "CHOL", "TRIG", "HDL", "CHOLHDL", "VLDL", "LDLCALC", "LDLDIRECT"   Wt Readings from Last 3 Encounters:  09/17/22 57.2 kg  12/12/14 61.2 kg  08/29/12 66.4 kg      Assessment and Plan:   1.   Labs/ tests ordered today include:  No orders of the defined types were placed in this encounter.    Disposition:   F/U with me in ***    Signed, Lauree Chandler, MD, [facility] 09/19/2022 4:46 PM    [facility] [address], [place], [place]  [address] Phone: [phone]; Fax: [phone]

## 2022-09-20 ENCOUNTER — Encounter: Payer: Self-pay | Admitting: Cardiovascular Disease

## 2022-09-20 ENCOUNTER — Ambulatory Visit: Payer: Medicare PPO | Attending: Cardiovascular Disease | Admitting: Cardiovascular Disease

## 2022-09-20 ENCOUNTER — Encounter (HOSPITAL_COMMUNITY): Payer: Self-pay

## 2022-09-20 ENCOUNTER — Ambulatory Visit (HOSPITAL_COMMUNITY): Payer: Medicare PPO

## 2022-09-20 VITALS — BP 134/82 | HR 68 | Ht 62.0 in | Wt 125.2 lb

## 2022-09-20 DIAGNOSIS — R072 Precordial pain: Secondary | ICD-10-CM

## 2022-09-20 DIAGNOSIS — R079 Chest pain, unspecified: Secondary | ICD-10-CM

## 2022-09-20 MED ORDER — METOPROLOL TARTRATE 50 MG PO TABS: 50.0000 mg | ORAL_TABLET | Freq: Once | ORAL | 0 refills | Status: DC

## 2022-09-20 NOTE — Patient Instructions (Addendum)
Medication Instructions:  No changes *If you need a refill on your cardiac medications before your next appointment, please call your pharmacy*   Lab Work: none  Testing/Procedures: Your physician has requested that you have an echocardiogram. Echocardiography is a painless test that uses sound waves to create images of your heart. It provides your doctor with information about the size and shape of your heart and how well your heart's chambers and valves are working. This procedure takes approximately one hour. There are no restrictions for this procedure. Please do NOT wear cologne, perfume, aftershave, or lotions (deodorant is allowed). Please arrive 15 minutes prior to your appointment time.  Cardiac CTA - see instructions below   Follow-Up: At [facility], you and your health needs are our priority.  As part of our continuing mission to provide you with exceptional heart care, we have created designated Provider Care Teams.  These Care Teams include your primary Cardiologist (physician) and Advanced Practice Providers (APPs -  Physician Assistants and Nurse Practitioners) who all work together to provide you with the care you need, when you need it.   Your next appointment:   4-8 week(s)  Provider:   Lauree Chandler, MD     Other Instructions   Your cardiac CT will be scheduled at  [facility] [place], [place] [address] [phone]  Please arrive at the [facility] and Children's Entrance (Entrance C2) of [facility] 30 minutes prior to test start time. You can use the FREE valet parking offered at entrance C (encouraged to control the heart rate for the test)  Proceed to the [facility] Radiology Department (first floor) to check-in and test prep.  All radiology patients and guests should use entrance C2 at [facility], accessed from [facility], even though the hospital's physical address listed is [address].      Please follow these instructions carefully (unless otherwise directed):   On the Night Before the Test: Be sure to Drink plenty of water. Do not consume any caffeinated/decaffeinated beverages or chocolate 12 hours prior to your test. Do not take any antihistamines 12 hours prior to your test.  On the Day of the Test: Drink plenty of water until 1 hour prior to the test. Do not eat any food 1 hour prior to test. You may take your regular medications prior to the test.  Take metoprolol (Lopressor) two hours prior to test. FEMALES- please wear underwire-free bra if available, avoid dresses & tight clothing       After the Test: Drink plenty of water. After receiving IV contrast, you may experience a mild flushed feeling. This is normal. On occasion, you may experience a mild rash up to 24 hours after the test. This is not dangerous. If this occurs, you can take Benadryl 25 mg and increase your fluid intake. If you experience trouble breathing, this can be serious. If it is severe call 911 IMMEDIATELY. If it is mild, please call our office. If you take any of these medications: Glipizide/Metformin, Avandament, Glucavance, please do not take 48 hours after completing test unless otherwise instructed.  We will call to schedule your test 2-4 weeks out understanding that some insurance companies will need an authorization prior to the service being performed.   For non-scheduling related questions, please contact the cardiac imaging nurse navigator should you have any questions/concerns: Marchia Bond, Cardiac Imaging Nurse Navigator Gordy Clement, Cardiac Imaging Nurse Navigator [facility]  Heart and Vascular Services Direct Office Dial: [phone]   For scheduling needs, including cancellations and rescheduling, please call [place], [phone].

## 2022-09-24 ENCOUNTER — Telehealth (HOSPITAL_COMMUNITY): Payer: Self-pay | Admitting: Emergency Medicine

## 2022-09-24 NOTE — Telephone Encounter (Signed)
Reaching out to patient to offer assistance regarding upcoming cardiac imaging study; pt verbalizes understanding of appt date/time, parking situation and where to check in, pre-test NPO status and medications ordered, and verified current allergies; name and call back number provided for further questions should they arise Marchia Bond RN Navigator Cardiac Imaging [facility] [phone] office [phone] cell   Arrival 1100 [address] entrnace Denies iv issues 22m metoprolol Aware contrast/nitro

## 2022-09-28 ENCOUNTER — Ambulatory Visit (HOSPITAL_COMMUNITY)
Admission: RE | Admit: 2022-09-28 | Discharge: 2022-09-28 | Disposition: A | Discharge status: Home / Self Care | Payer: Medicare PPO | Source: Ambulatory Visit | Attending: Cardiovascular Disease | Admitting: Cardiovascular Disease

## 2022-09-28 DIAGNOSIS — R079 Chest pain, unspecified: Secondary | ICD-10-CM | POA: Diagnosis not present

## 2022-09-28 DIAGNOSIS — R072 Precordial pain: Secondary | ICD-10-CM | POA: Insufficient documentation

## 2022-09-28 MED ORDER — NITROGLYCERIN 0.4 MG SL SUBL
SUBLINGUAL_TABLET | SUBLINGUAL | Status: AC
  Administered 2022-09-28: 0.8 mg via SUBLINGUAL
  Filled 2022-09-28: qty 2

## 2022-09-28 MED ORDER — IOHEXOL 350 MG/ML SOLN
100.0000 mL | Freq: Once | INTRAVENOUS | Status: AC | PRN
  Administered 2022-09-28: 100 mL via INTRAVENOUS

## 2022-09-28 MED ORDER — NITROGLYCERIN 0.4 MG SL SUBL
0.8000 mg | SUBLINGUAL_TABLET | Freq: Once | SUBLINGUAL | Status: AC
Start: 2022-09-28 — End: 2022-09-28

## 2022-10-05 ENCOUNTER — Telehealth: Payer: Self-pay | Admitting: *Deleted

## 2022-10-05 MED ORDER — ROSUVASTATIN CALCIUM 5 MG PO TABS: 5.0000 mg | ORAL_TABLET | Freq: Every day | ORAL | 3 refills | Status: DC

## 2022-10-05 MED ORDER — ASPIRIN 81 MG PO TBEC: 81.0000 mg | DELAYED_RELEASE_TABLET | Freq: Every day | ORAL | 3 refills | Status: AC

## 2022-10-05 NOTE — Telephone Encounter (Signed)
Called and reviewed with patient. She already is taking an aspirin 81 mg daily and will continue this as well as start Crestor 5 mg daily.

## 2022-10-05 NOTE — Telephone Encounter (Signed)
-----   Message from Burnell Blanks, MD sent at 09/28/2022  2:15 PM EST ----- Calcium score of zero. She has a very mild soft plaque, less than 20% stenosis, in the LAD. This is very mild and will not cause chest pain or dyspnea. Given coronary plaque, I would recommend that she start ASA 81 mg daily and Crestor 5 mg daily. cdm

## 2022-10-06 ENCOUNTER — Ambulatory Visit (HOSPITAL_COMMUNITY)
Admission: RE | Admit: 2022-10-06 | Discharge: 2022-10-06 | Disposition: A | Discharge status: Home / Self Care | Payer: Medicare PPO | Source: Ambulatory Visit | Attending: Cardiovascular Disease | Admitting: Cardiovascular Disease

## 2022-10-06 DIAGNOSIS — R079 Chest pain, unspecified: Secondary | ICD-10-CM | POA: Insufficient documentation

## 2022-10-06 LAB — ECHOCARDIOGRAM COMPLETE
Area-P 1/2: 3.85 cm2
P 1/2 time: 755 msec
S' Lateral: 2.4 cm

## 2022-10-06 NOTE — Progress Notes (Signed)
*  PRELIMINARY RESULTS* Echocardiogram 2D Echocardiogram has been performed.  Amanda Vang 10/06/2022, 2:05 PM

## 2022-10-28 IMAGING — MR MR WRIST*L* W/CM
6 series · 36 of 40 positions shown · IV contrast (agent unspecified)
Comparison: Left wrist radiograph 12/24/2020

CLINICAL DATA: MR arthrogram left wrist eval TFCC tear vs ECU
sublux

EXAM:
MRI OF THE LEFT WRIST WITH CONTRAST (MR Arthrogram)
TECHNIQUE: Multiplanar, multisequence MR imaging of the wrist was performed
immediately following contrast injection into the radiocarpal joint
under fluoroscopic guidance. No intravenous contrast was
administered.

[Series 4: T1 fat-sat · axial · 3.0mm · 0.20mm/px · z∈[-2,+70]mm · 6 of 23 slices shown (1 of 3)]
[im 1/23]
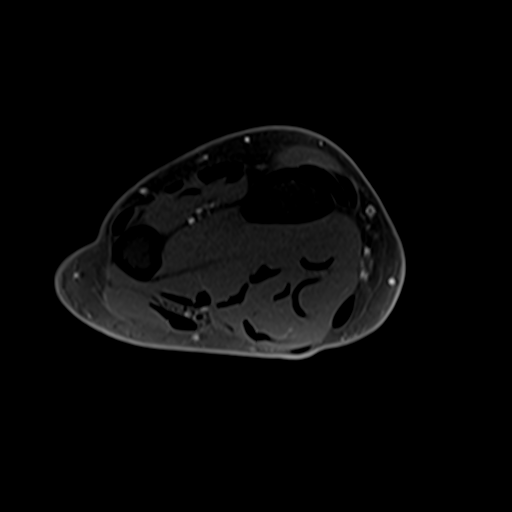
[im 5/23]
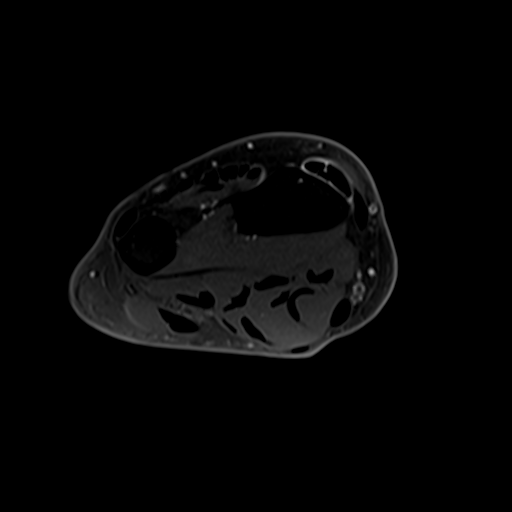
[im 9/23]
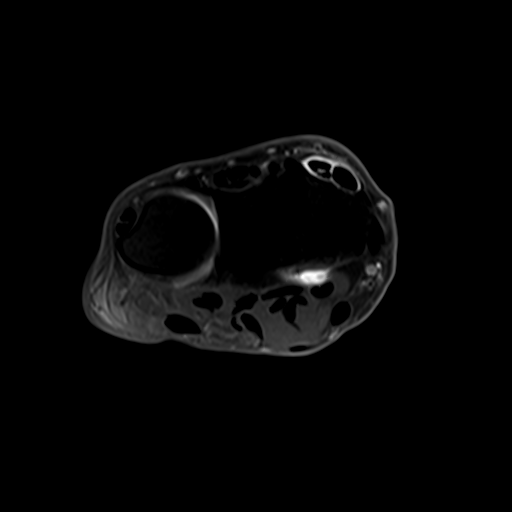
[im 14/23]
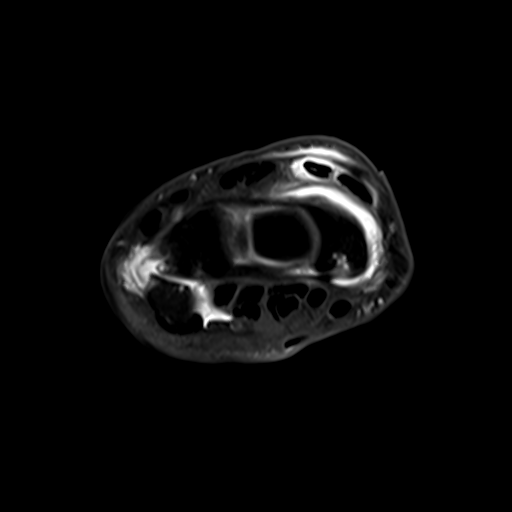
[im 18/23]
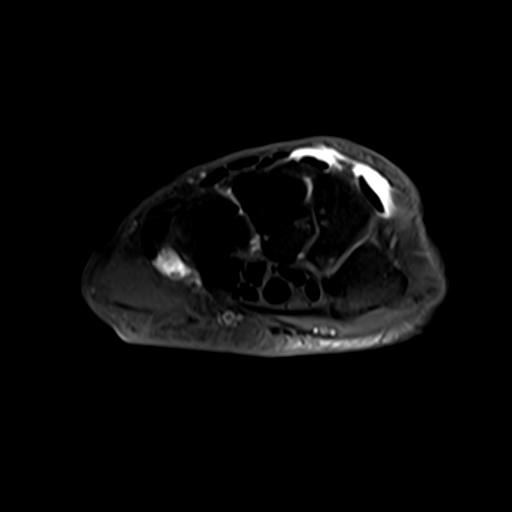
[im 23/23]
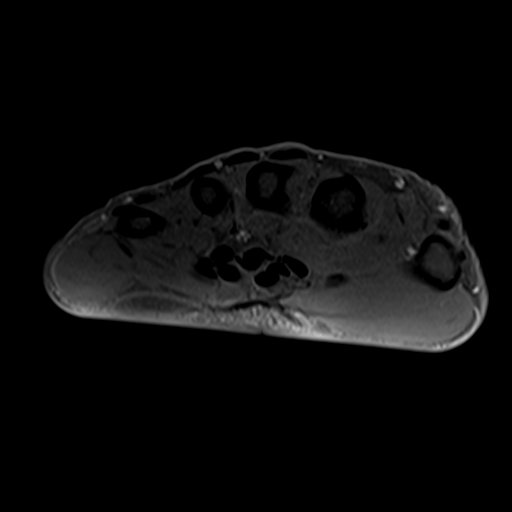

[Series 5: T2 fat-sat · axial · 3.0mm · 0.39mm/px · z∈[-2,+70]mm · 7 of 23 slices shown (1 of 3)]
[im 1/23]
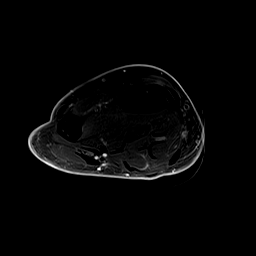
[im 4/23]
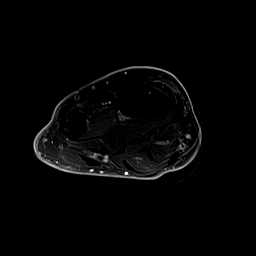
[im 8/23]
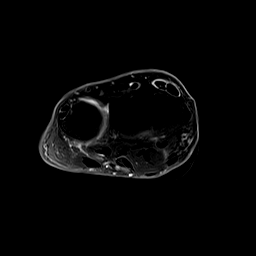
[im 12/23]
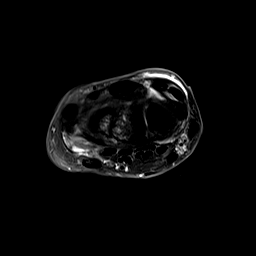
[im 15/23]
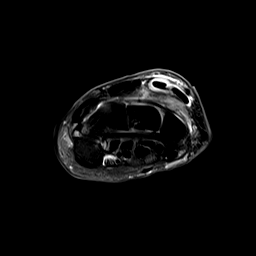
[im 19/23]
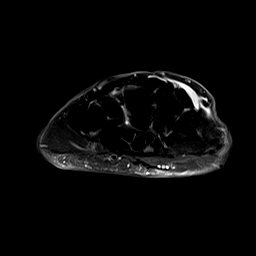
[im 23/23]
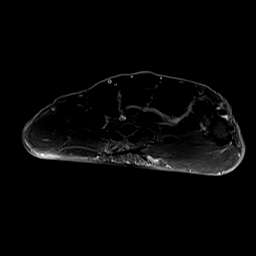

[Series 6: T1 fat-sat · coronal · 3.0mm · 0.20mm/px · 6 of 18 slices shown (2 of 3)]
[im 1/18]
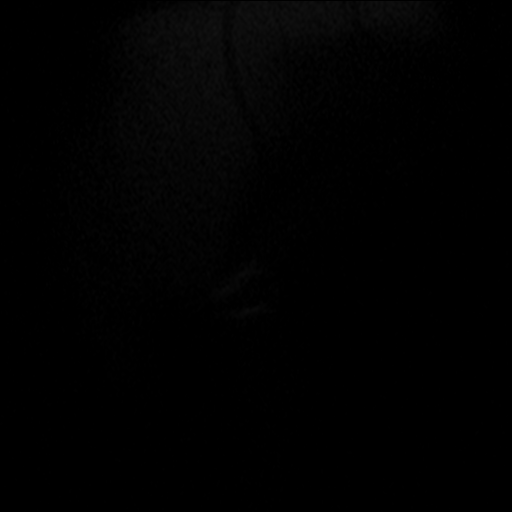
[im 4/18]
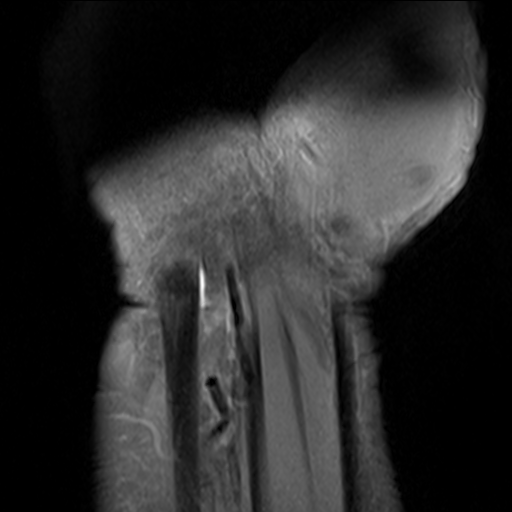
[im 7/18]
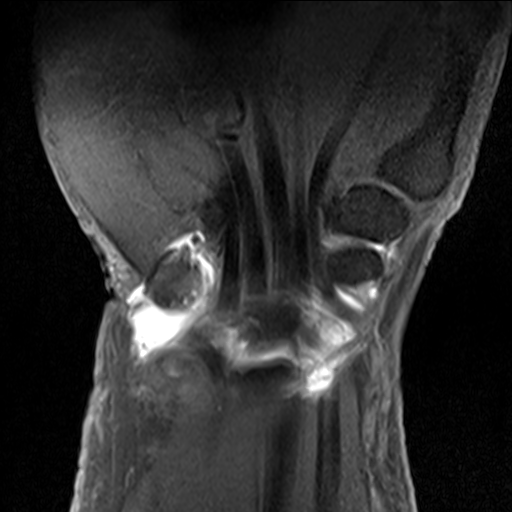
[im 11/18]
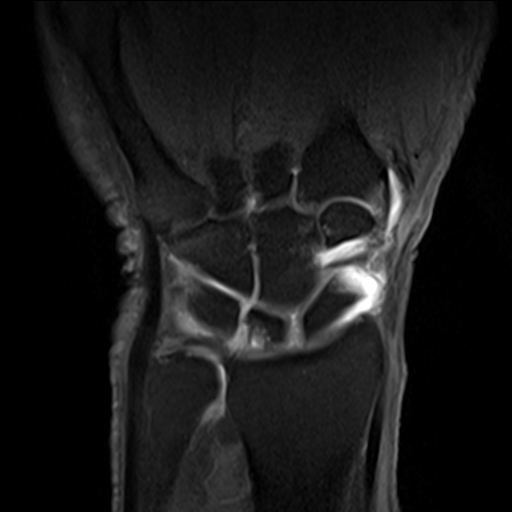
[im 14/18]
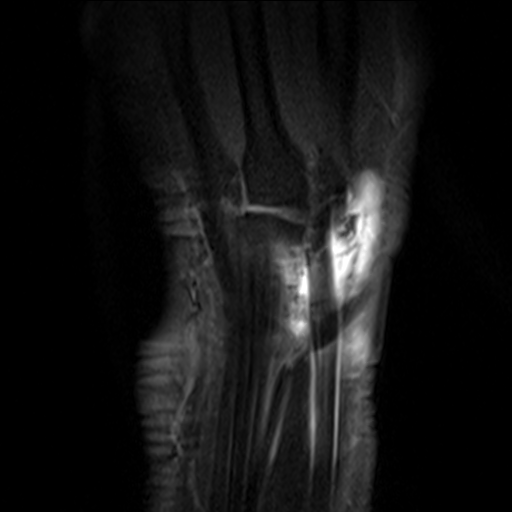
[im 18/18]
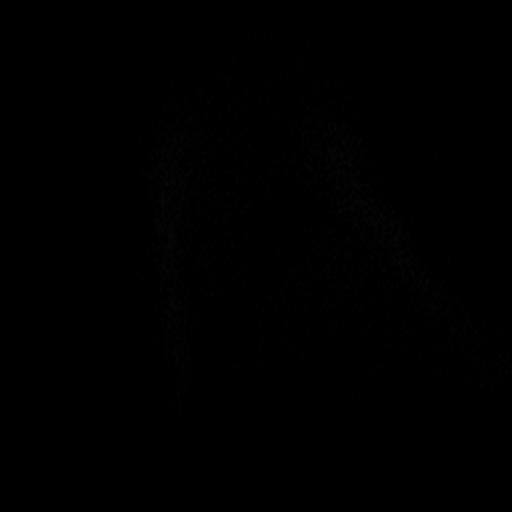

[Series 7: T1 fat-sat · coronal · 3.0mm · 0.31mm/px · 2 of 18 slices shown (3 of 3)]
[im 1/18]
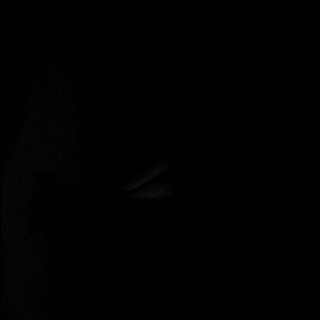
[im 4/18]
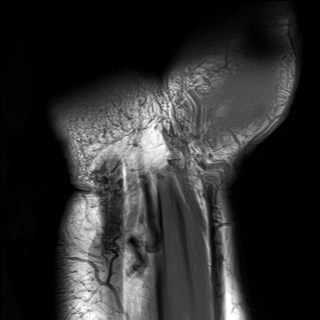

[Series 8: T2 fat-sat · coronal · 3.0mm · 0.39mm/px · 6 of 18 slices shown (2 of 3)]
[im 1/18]
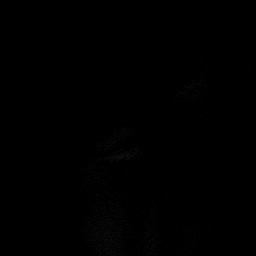
[im 4/18]
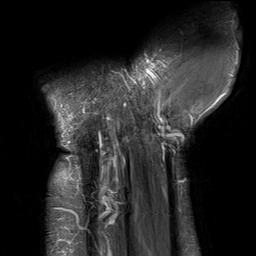
[im 7/18]
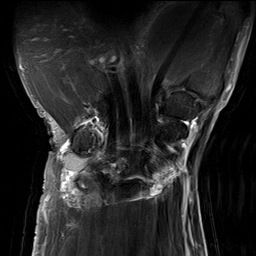
[im 11/18]
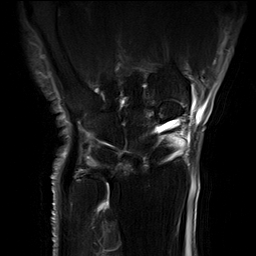
[im 14/18]
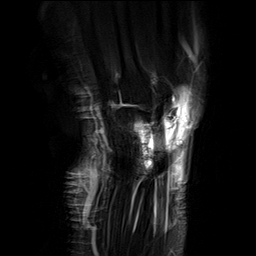
[im 18/18]
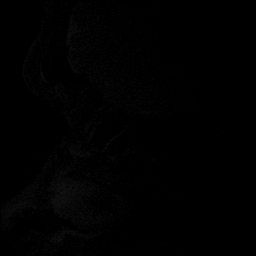

[Series 9: T2 fat-sat · sagittal · 3.0mm · 0.39mm/px · 9 of 27 slices shown (3 of 3)]
[im 1/27]
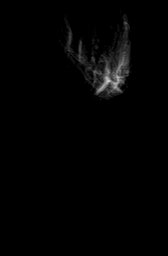
[im 4/27]
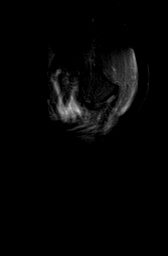
[im 7/27]
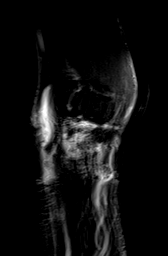
[im 10/27]
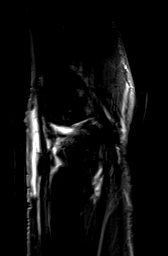
[im 14/27]
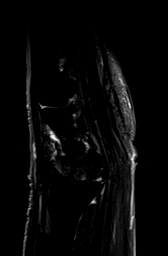
[im 17/27]
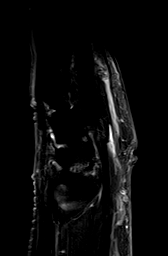
[im 20/27]
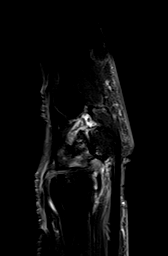
[im 23/27]
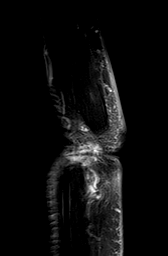
[im 27/27]
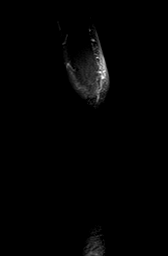

[36 of 40 positions shown; findings below may reference images not displayed]

FINDINGS: Ligaments: Scapholunate and lunotriquetral ligaments are intact.

Triangular fibrocartilage: Central perforation of the TFCC articular
disc with contrast spilling into the DRUJ.

Tendons: Fluid within extensor compartments 2 and 3, likely related
to contrast injection technique. Small linear cleft within the
extensor carpi ulnaris tendon within the ulnar groove. The flexor
tendons are unremarkable.

Carpal tunnel/median nerve: Flexor retinaculum is intact. Normal
carpal tunnel without a mass. Median nerve demonstrates normal
signal and caliber.

Guyon's canal: Normal Guyon's canal. Normal ulnar nerve.

Joint/cartilage: Full-thickness cartilage defect of the
proximal/ulnar side of the lunate measuring 5 mm in width.

Bones/carpal alignment: Positive ulnar variance. There is bony edema
and cystic change in the lunate and triquetrum with chondral
irregularity/arthritis.

Other: Muscles are normal. No fluid collection, hematoma, or soft
tissue mass.
IMPRESSION: Positive ulnar variance with findings of ulnar abutment syndrome.
Central perforation of the TFCC articular disc and degenerative
changes in the lunate and triquetrum. Full-thickness cartilage
defect of the proximal/ulnar side of the lunate measuring 5 mm in
width.

Small linear cleft within the extensor carpi ulnaris tendon within
the ulnar groove, could be a small short-segment split tear.

Intact scapholunate and lunotriquetral ligaments.

## 2022-10-31 NOTE — Progress Notes (Unsigned)
No chief complaint on file.  History of Present Illness: 72 yo female with no chronic medical problems who is here today for follow up. I saw her as a new patient for the evaluation of chest pain on 09/20/22. She has had chest pain on/off over the past ten years. She was seen remotely by Dr. Acie Fredrickson. She was seen in the ED 2/924 with chest pian which was felt to be atypical for cardiac pain. Troponin negative x 2. EKG without ischemic changes. Chest CTA with no evidence of aortic dissection. 4.0 cm stable ascending aortic aneurysm. At her first visit here she described chest pain and nausea while cooking with radiation of pain into her right arm. The next day she had pain in her left arm. The following day she was dizzy and weak. Coronary CTA 09/28/22 with mild LAD stenosis. Calcium score of zero. Echo 10/06/22 with LVEF of 75%, mild aortic stenosis. She was asked to start ASA and Crestor.   She is here today for follow up. The patient denies any chest pain, dyspnea, palpitations, lower extremity edema, orthopnea, PND, dizziness, near syncope or syncope.   Primary Care Physician: Pablo Lawrence, NP   Past Medical History:  Diagnosis Date   Medical history non-contributory    No pertinent past medical history     Past Surgical History:  Procedure Laterality Date   CATARACT EXTRACTION [facility]  04/05/2011   Procedure: CATARACT EXTRACTION PHACO AND INTRAOCULAR LENS PLACEMENT ([place]);  Surgeon: Tonny Branch;  Location: [facility];  Service: Ophthalmology;  Laterality: Left;  CDE 11.84   CATARACT EXTRACTION W/PHACO  09/04/2012   Procedure: CATARACT EXTRACTION PHACO AND INTRAOCULAR LENS PLACEMENT ([facility]);  Surgeon: Tonny Branch, MD;  Location: [facility];  Service: Ophthalmology;  Laterality: Right;  CDE=14.70   colonoscopy  12/25/2010   normal   GAS INSERTION Left 12/12/2014   Procedure: INSERTION OF GAS;  Surgeon: Hurman Horn, MD;  Location: [place];  Service: Ophthalmology;  Laterality: Left;   PARS PLANA  VITRECTOMY Left 12/12/2014   Procedure: PARS PLANA VITRECTOMY WITH 25 GAUGE with endolaser ;  Surgeon: Hurman Horn, MD;  Location: [place];  Service: Ophthalmology;  Laterality: Left;  add on 5pm   TUBAL LIGATION     VARICOSE VEIN SURGERY      Current Outpatient Medications  Medication Sig Dispense Refill   aspirin EC 81 MG tablet Take 1 tablet (81 mg total) by mouth daily. Swallow whole. 90 tablet 3   Calcium Carbonate-Vitamin D (CALCIUM 600+D) 600-400 MG-UNIT per tablet Take 1 tablet by mouth 2 (two) times daily.       DENAVIR 1 % cream Apply 1 application topically as needed. Fever Blister     ibuprofen (ADVIL,MOTRIN) 200 MG tablet Take 200 mg by mouth every 6 (six) hours as needed. Pain      loratadine (CLARITIN) 10 MG tablet Take 10 mg by mouth daily as needed for allergies.      meloxicam (MOBIC) 15 MG tablet Take 15 mg by mouth daily.     metoprolol tartrate (LOPRESSOR) 50 MG tablet Take 1 tablet (50 mg total) by mouth once for 1 dose. Take 90-120 minutes prior to scan. 1 tablet 0   Multiple Vitamin (MULTIVITAMIN WITH MINERALS) TABS Take 1 tablet by mouth daily.     rosuvastatin (CRESTOR) 5 MG tablet Take 1 tablet (5 mg total) by mouth daily. 90 tablet 3   No current facility-administered medications for this visit.    No  Known Allergies  Social History   Socioeconomic History   Marital status: Widowed    Spouse name: Not on file   Number of children: 2   Years of education: Not on file   Highest education level: Not on file  Occupational History   Occupation: Environmental health practitioner at [employer] Use   Smoking status: Never   Smokeless tobacco: Never  Substance and Sexual Activity   Alcohol use: Yes    Alcohol/week: 7.0 standard drinks of alcohol    Types: 7 Glasses of wine per week   Drug use: No   Sexual activity: Yes    Birth control/protection: None  Other Topics Concern   Not on file  Social History Narrative   Not on file   Social  Determinants of Health   Financial Resource Strain: Not on file  Food Insecurity: Not on file  Transportation Needs: Not on file  Physical Activity: Not on file  Stress: Not on file  Social Connections: Not on file  Intimate Partner Violence: Not on file    Family History  Problem Relation Age of Onset   Diverticulosis Mother    Dementia Father    Anesthesia problems Neg Hx    Hypotension Neg Hx    Malignant hyperthermia Neg Hx    Pseudochol deficiency Neg Hx     Review of Systems:  As stated in the HPI and otherwise negative.   There were no vitals taken for this visit.  Physical Examination: General: Well developed, well nourished, NAD  HEENT: OP clear, mucus membranes moist  SKIN: warm, dry. No rashes. Neuro: No focal deficits  Musculoskeletal: Muscle strength 5/5 all ext  Psychiatric: Mood and affect normal  Neck: No JVD, no carotid bruits, no thyromegaly, no lymphadenopathy.  Lungs:Clear bilaterally, no wheezes, rhonci, crackles Cardiovascular: Regular rate and rhythm. No murmurs, gallops or rubs. Abdomen:Soft. Bowel sounds present. Non-tender.  Extremities: No lower extremity edema. Pulses are 2 + in the bilateral DP/PT.  EKG:  EKG is not *** ordered today. The ekg ordered today demonstrates   Echo 10/06/22: 1. Left ventricular ejection fraction, by estimation, is 70 to 75%. The  left ventricle has hyperdynamic function. The left ventricle has no  regional wall motion abnormalities. Left ventricular diastolic parameters  are consistent with Grade I diastolic  dysfunction (impaired relaxation).   2. Right ventricular systolic function is normal. The right ventricular  size is normal. Tricuspid regurgitation signal is inadequate for assessing  PA pressure.   3. Left atrial size was mildly dilated.   4. Right atrial size was moderately dilated.   5. The mitral valve is normal in structure. No evidence of mitral valve  regurgitation. No evidence of mitral  stenosis.   6. The aortic valve is tricuspid. Aortic valve regurgitation is mild. No  aortic stenosis is present.   7. The inferior vena cava is normal in size with greater than 50%  respiratory variability, suggesting right atrial pressure of 3 mmHg.   Recent Labs: 09/17/2022: ALT 15; BUN 13; Creatinine, Ser 0.93; Hemoglobin 14.9; Platelets 210; Potassium 4.1; Sodium 137   Lipid Panel No results found for: "CHOL", "TRIG", "HDL", "CHOLHDL", "VLDL", "LDLCALC", "LDLDIRECT"   Wt Readings from Last 3 Encounters:  09/20/22 56.8 kg  09/17/22 57.2 kg  12/12/14 61.2 kg    Assessment and Plan:   1. CAD without angina: Her chest pain is not related to her mild CAD. Continue ASA and statin.    2. Aortic stenosis:  Mild AS by echo in February 2024. Will repeat echo in February 2026.   Labs/ tests ordered today include:  No orders of the defined types were placed in this encounter.  Disposition:   F/U with me in 4-8 weeks   Signed, Lauree Chandler, MD, [facility] 10/31/2022 6:23 PM    [facility] [place], [place],   [address] Phone: [phone]; Fax: [phone]

## 2022-11-01 ENCOUNTER — Ambulatory Visit: Payer: Medicare PPO | Attending: Cardiovascular Disease | Admitting: Cardiovascular Disease

## 2022-11-01 ENCOUNTER — Encounter: Payer: Self-pay | Admitting: Cardiovascular Disease

## 2022-11-01 VITALS — BP 110/70 | HR 55 | Ht 62.0 in | Wt 126.2 lb

## 2022-11-01 DIAGNOSIS — I350 Nonrheumatic aortic (valve) stenosis: Secondary | ICD-10-CM | POA: Diagnosis not present

## 2022-11-01 DIAGNOSIS — I8393 Asymptomatic varicose veins of bilateral lower extremities: Secondary | ICD-10-CM | POA: Diagnosis not present

## 2022-11-01 DIAGNOSIS — I2510 Atherosclerotic heart disease of native coronary artery without angina pectoris: Secondary | ICD-10-CM | POA: Diagnosis not present

## 2022-11-01 DIAGNOSIS — I7121 Aneurysm of the ascending aorta, without rupture: Secondary | ICD-10-CM

## 2022-11-01 NOTE — Patient Instructions (Signed)
Medication Instructions:  No changes *If you need a refill on your cardiac medications before your next appointment, please call your pharmacy*   Lab Work: Please return in about 2 months for fasting lipids/liver function  If you have labs (blood work) drawn today and your tests are completely normal, you will receive your results only by: [place] (if you have MyChart) OR A paper copy in the mail If you have any lab test that is abnormal or we need to change your treatment, we will call you to review the results.   Testing/Procedures: none   Follow-Up: At [facility], you and your health needs are our priority.  As part of our continuing mission to provide you with exceptional heart care, we have created designated Provider Care Teams.  These Care Teams include your primary Cardiologist (physician) and Advanced Practice Providers (APPs -  Physician Assistants and Nurse Practitioners) who all work together to provide you with the care you need, when you need it.  We recommend signing up for the patient portal called "MyChart".  Sign up information is provided on this After Visit Summary.  MyChart is used to connect with patients for Virtual Visits (Telemedicine).  Patients are able to view lab/test results, encounter notes, upcoming appointments, etc.  Non-urgent messages can be sent to your provider as well.   To learn more about what you can do with MyChart, go to [email or web address].    Your next appointment:   12 month(s)  Provider:   Lauree Chandler, MD     Other Instructions You have been referred to Vein & Vascular Surgery

## 2022-11-10 ENCOUNTER — Other Ambulatory Visit: Payer: Self-pay | Admitting: *Deleted

## 2022-11-10 DIAGNOSIS — M79604 Pain in right leg: Secondary | ICD-10-CM

## 2022-11-11 ENCOUNTER — Ambulatory Visit (HOSPITAL_COMMUNITY)
Admission: RE | Admit: 2022-11-11 | Discharge: 2022-11-11 | Disposition: A | Discharge status: Home / Self Care | Payer: Medicare PPO | Source: Ambulatory Visit | Attending: Vascular Surgery | Admitting: Vascular Surgery

## 2022-11-11 DIAGNOSIS — M79605 Pain in left leg: Secondary | ICD-10-CM

## 2022-11-11 DIAGNOSIS — M79604 Pain in right leg: Secondary | ICD-10-CM | POA: Diagnosis present

## 2022-11-17 ENCOUNTER — Other Ambulatory Visit (HOSPITAL_COMMUNITY): Payer: Self-pay | Admitting: Adult Health Nurse Practitioner

## 2022-11-17 DIAGNOSIS — M8589 Other specified disorders of bone density and structure, multiple sites: Secondary | ICD-10-CM

## 2022-11-30 ENCOUNTER — Ambulatory Visit: Payer: Medicare PPO | Admitting: Vascular Surgery

## 2022-11-30 ENCOUNTER — Encounter: Payer: Self-pay | Admitting: Vascular Surgery

## 2022-11-30 VITALS — BP 142/86 | HR 61 | Temp 98.1°F | Resp 14 | Ht 62.5 in | Wt 125.0 lb

## 2022-11-30 DIAGNOSIS — I872 Venous insufficiency (chronic) (peripheral): Secondary | ICD-10-CM | POA: Insufficient documentation

## 2022-11-30 NOTE — Progress Notes (Signed)
Patient name: Amanda Vang MRN: [id number] DOB: 12-25-1950 Sex: female  REASON FOR CONSULT: Bilateral lower extremity varicose veins  HPI: Amanda Vang is a 72 y.o. female, with history of hyperlipidemia that presents for evaluation of bilateral lower extremity varicose veins.  Patient previously underwent bilateral great saphenous vein ablation in 2012 at [facility].  She has been wearing her thigh-high compression stockings.  She has noticed more prominent bulging veins on her right foot.  She just wanted to make sure that everything is okay.  Past Medical History:  Diagnosis Date   Medical history non-contributory    No pertinent past medical history     Past Surgical History:  Procedure Laterality Date   CATARACT EXTRACTION W/PHACO  04/05/2011   Procedure: CATARACT EXTRACTION PHACO AND INTRAOCULAR LENS PLACEMENT ([facility]);  Surgeon: Gemma Payor;  Location[facility];  Service: Ophthalmology;  Laterality: Left;  CDE 11.84   CATARACT EXTRACTION W/PHACO  09/04/2012   Procedure: CATARACT EXTRACTION PHACO AND INTRAOCULAR LENS PLACEMENT ([facility]);  Surgeon: Gemma Payor, MD;  Location: [facility];  Service: Ophthalmology;  Laterality: Right;  CDE=14.70   colonoscopy  12/25/2010   normal   GAS INSERTION Left 12/12/2014   Procedure: INSERTION OF GAS;  Surgeon: Edmon Crape, MD;  [facility];  Service: Ophthalmology;  Laterality: Left;   PARS PLANA VITRECTOMY Left 12/12/2014   Procedure: PARS PLANA VITRECTOMY WITH 25 GAUGE with endolaser ;  Surgeon: Edmon Crape, MD;  Location: [facility] OR;  Service: Ophthalmology;  Laterality: Left;  add on 5pm   TUBAL LIGATION     VARICOSE VEIN SURGERY      Family History  Problem Relation Age of Onset   Diverticulosis Mother    Dementia Father    Anesthesia problems Neg Hx    Hypotension Neg Hx    Malignant hyperthermia Neg Hx    Pseudochol deficiency Neg Hx     SOCIAL HISTORY: Social History   Socioeconomic History   Marital status: Widowed     Spouse name: Not on file   Number of children: 2   Years of education: Not on file   Highest education level: Not on file  Occupational History   Occupation: Chief Strategy Officer at [employer]  Tobacco Use   Smoking status: Never   Smokeless tobacco: Never  Substance and Sexual Activity   Alcohol use: Yes    Alcohol/week: 7.0 standard drinks of alcohol    Types: 7 Glasses of wine per week   Drug use: No   Sexual activity: Yes    Birth control/protection: None  Other Topics Concern   Not on file  Social History Narrative   Not on file   Social Determinants of Health   Financial Resource Strain: Not on file  Food Insecurity: Not on file  Transportation Needs: Not on file  Physical Activity: Not on file  Stress: Not on file  Social Connections: Not on file  Intimate Partner Violence: Not on file    No Known Allergies  Current Outpatient Medications  Medication Sig Dispense Refill   aspirin EC 81 MG tablet Take 1 tablet (81 mg total) by mouth daily. Swallow whole. 90 tablet 3   Calcium Carbonate-Vitamin D (CALCIUM 600+D) 600-400 MG-UNIT per tablet Take 1 tablet by mouth 2 (two) times daily.       DENAVIR 1 % cream Apply 1 application topically as needed. Fever Blister     ibuprofen (ADVIL,MOTRIN) 200 MG tablet Take 200 mg by  mouth every 6 (six) hours as needed. Pain      loratadine (CLARITIN) 10 MG tablet Take 10 mg by mouth daily as needed for allergies.      meloxicam (MOBIC) 15 MG tablet Take 15 mg by mouth as needed.     Multiple Vitamin (MULTIVITAMIN WITH MINERALS) TABS Take 1 tablet by mouth daily.     rosuvastatin (CRESTOR) 5 MG tablet Take 1 tablet (5 mg total) by mouth daily. 90 tablet 3   No current facility-administered medications for this visit.    REVIEW OF SYSTEMS:  [X]  denotes positive finding, [ ]  denotes negative finding Cardiac  Comments:  Chest pain or chest pressure:    Shortness of breath upon exertion:    Short of breath when lying  flat:    Irregular heart rhythm:        Vascular    Pain in calf, thigh, or hip brought on by ambulation:    Pain in feet at night that wakes you up from your sleep:     Blood clot in your veins:    Leg swelling:         Pulmonary    Oxygen at home:    Productive cough:     Wheezing:         Neurologic    Sudden weakness in arms or legs:     Sudden numbness in arms or legs:     Sudden onset of difficulty speaking or slurred speech:    Temporary loss of vision in one eye:     Problems with dizziness:         Gastrointestinal    Blood in stool:     Vomited blood:         Genitourinary    Burning when urinating:     Blood in urine:        Psychiatric    Major depression:         Hematologic    Bleeding problems:    Problems with blood clotting too easily:        Skin    Rashes or ulcers:        Constitutional    Fever or chills:      PHYSICAL EXAM: Vitals:   11/30/22 0840  BP: (!) 142/86  Pulse: 61  Resp: 14  Temp: 98.1 F (36.7 C)  TempSrc: Temporal  SpO2: 97%  Weight: 125 lb (56.7 kg)  Height: 5' 2.5" (1.588 m)    GENERAL: The patient is a well-nourished female, in no acute distress. The vital signs are documented above. CARDIAC: There is a regular rate and rhythm.  VASCULAR:  Bilateral femoral pulses palpable Bilateral PT pulses palpable Mostly bilateral lower extremity spider veins particularly in the thighs PULMONARY: No respiratory distress. ABDOMEN: Soft and non-tender. MUSCULOSKELETAL: There are no major deformities or cyanosis. NEUROLOGIC: No focal weakness or paresthesias are detected. SKIN: There are no ulcers or rashes noted. PSYCHIATRIC: The patient has a normal affect.  DATA:   Lower Venous Reflux Study   Patient Name:  Amanda Vang  Date of Exam:   11/11/2022  Medical Rec #: [id number]        Accession #:    [id number]  Date of Birth: Sep 03, 1950        Patient Gender: F  Patient Age:   6 years  Ex[facility]  Procedure:      VAS [place] LOWER EXTREMITY VENOUS REFLUX  Referring  Phys: Sherald Hess    ---------------------------------------------------------------------------  -----    Indications: Swelling.    Comparison Study: No prior study   Performing Technologist: [place] Ponder RVS     Examination Guidelines: A complete evaluation includes B-mode imaging,  spectral  Doppler, color Doppler, and power Doppler as needed of all accessible  portions  of each vessel. Bilateral testing is considered an integral part of a  complete  examination. Limited examinations for reoccurring indications may be  performed  as noted. The reflux portion of the exam is performed with the patient in  reverse Trendelenburg.  Significant venous reflux is defined as >500 ms in the superficial venous  system, and >1 second in the deep venous system.     Venous Reflux Times  +------------+---------+------+---------+------------+---------------------  ---+  RIGHT      Reflux NoReflux Reflux  Diameter cmsComments                                         Yes    Time                                          +------------+---------+------+---------+------------+---------------------  ---+  CFV                  yes  >1 second                                       +------------+---------+------+---------+------------+---------------------  ---+  FV mid                yes  >1 second                                       +------------+---------+------+---------+------------+---------------------  ---+  Popliteal  no                                                             +------------+---------+------+---------+------------+---------------------  ---+  GSV at [facility]            yes   >500 ms     0.32    Out of fascia- s/p                                                         ablation                    +------------+---------+------+---------+------------+---------------------  ---+  SSV Pop     no                          0.17  Fossa                                                                      +------------+---------+------+---------+------------+---------------------  ---+  SSV prox                                        NWV                        calf                                                                       +------------+---------+------+---------+------------+---------------------  ---+  AASV       no                                                             +------------+---------+------+---------+------------+---------------------  ---+      Summary:  Right:  - No evidence of deep vein thrombosis seen in the right lower extremity,  from the common femoral through the popliteal veins.  - No evidence of superficial venous thrombosis in the right lower  extremity.    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  - The great saphenous vein is incompetent at the [facility], then is occluded  consistent with previous ablation.  - The small saphenous vein is competent.    *See table(s) above for measurements and observations.   Electronically signed by Gerarda Fraction on 11/12/2022 at 5:52:54 PM.      Assessment/Plan:  72 year old female presents for evaluation of bilateral lower extremity varicose veins.  She previously had bilateral great saphenous vein ablation at [facility] in 2012.  She is more concerned about her right leg today.  I discussed that her reflux study shows that her right great saphenous vein ablation remains successful with no evidence of recanalization.  All of her reflux is in the deep venous system.  Discussed the deep venous system is not amendable to further surgical intervention.  I have recommended conservative management with elevation, compression stockings, exercise etc.   She does have a number of spider veins on exam and discussed this could be treated with sclerotherapy but is purely elective.  She has palpable posterior tibial pulse on exam and discussed no evidence of arterial insufficiency.   Cephus Shelling, MD [facility] Office: [phone]

## 2023-01-04 ENCOUNTER — Ambulatory Visit: Payer: Medicare PPO | Attending: Cardiovascular Disease

## 2023-01-04 DIAGNOSIS — I7121 Aneurysm of the ascending aorta, without rupture: Secondary | ICD-10-CM

## 2023-01-04 DIAGNOSIS — I2510 Atherosclerotic heart disease of native coronary artery without angina pectoris: Secondary | ICD-10-CM

## 2023-01-05 LAB — HEPATIC FUNCTION PANEL
ALT: 15 IU/L (ref 0–32)
AST: 18 IU/L (ref 0–40)
Albumin: 4.2 g/dL (ref 3.8–4.8)
Alkaline Phosphatase: 81 IU/L (ref 44–121)
Bilirubin Total: 0.6 mg/dL (ref 0.0–1.2)
Bilirubin, Direct: 0.19 mg/dL (ref 0.00–0.40)
Total Protein: 6.2 g/dL (ref 6.0–8.5)

## 2023-01-05 LAB — LIPID PANEL
Chol/HDL Ratio: 1.7 ratio (ref 0.0–4.4)
Cholesterol, Total: 154 mg/dL (ref 100–199)
HDL: 90 mg/dL (ref >39)
LDL Chol Calc (NIH): 49 mg/dL (ref 0–99)
Triglycerides: 79 mg/dL (ref 0–149)
VLDL Cholesterol Cal: 15 mg/dL (ref 5–40)

## 2023-05-09 ENCOUNTER — Encounter (INDEPENDENT_AMBULATORY_CARE_PROVIDER_SITE_OTHER): Payer: Medicare PPO | Admitting: Ophthalmology

## 2023-09-18 ENCOUNTER — Other Ambulatory Visit: Payer: Self-pay | Admitting: Cardiovascular Disease

## 2023-11-17 ENCOUNTER — Encounter: Payer: Self-pay | Admitting: Cardiovascular Disease

## 2023-11-17 ENCOUNTER — Ambulatory Visit: Payer: Medicare PPO | Attending: Cardiovascular Disease | Admitting: Cardiovascular Disease

## 2023-11-17 VITALS — BP 132/80 | HR 78 | Resp 16 | Ht 62.0 in | Wt 128.0 lb

## 2023-11-17 DIAGNOSIS — I7121 Aneurysm of the ascending aorta, without rupture: Secondary | ICD-10-CM

## 2023-11-17 DIAGNOSIS — I2510 Atherosclerotic heart disease of native coronary artery without angina pectoris: Secondary | ICD-10-CM | POA: Diagnosis not present

## 2023-11-17 DIAGNOSIS — I773 Arterial fibromuscular dysplasia: Secondary | ICD-10-CM

## 2023-11-17 DIAGNOSIS — I351 Nonrheumatic aortic (valve) insufficiency: Secondary | ICD-10-CM | POA: Diagnosis not present

## 2023-11-17 MED ORDER — METOPROLOL SUCCINATE ER 25 MG PO TB24: 25.0000 mg | ORAL_TABLET | Freq: Every day | ORAL | 3 refills | Status: AC

## 2023-11-17 MED ORDER — ROSUVASTATIN CALCIUM 10 MG PO TABS: 10.0000 mg | ORAL_TABLET | Freq: Every day | ORAL | 3 refills | Status: AC

## 2023-11-17 NOTE — Patient Instructions (Addendum)
 Medication Instructions:  Your physician has recommended you make the following change in your medication:  1.) increase Crestor to 10 mg - one tablet daily 2.) start metoprolol succinate (TOPROL XL) 25 mg - take one tablet daily  *If you need a refill on your cardiac medications before your next appointment, please call your pharmacy*  Lab Work: Return to [facility] in about 12 weeks (lipids/liver function) You may go to any of these [facility] locations:   [facility] - [address] ([facility]) - [address] [phone] [address]   [place] - [address]    [place]  - [address]    [place] - [address] - [address] [employer]  - [address] [place] [address] ([facility])  [place]   - [address]   Testing/Procedures: Head and Neck CT Angiogram   Follow-Up: At [facility], you and your health needs are our priority.  As part of our continuing mission to provide you with exceptional heart care, our providers are all part of one team.  This team includes your primary Cardiologist (physician) and Advanced Practice Providers or APPs (Physician Assistants and Nurse Practitioners) who all work together to provide you with the care you need, when you need it.  Your next appointment:   12 month(s)  Provider:   Verne Carrow, MD     Other Instructions We will reach out to the vascular surgery office and let them know Dr. Clifton James recommends a sooner appointment for you.    1st Floor: - Lobby - Registration  - Pharmacy  - Lab - Cafe  2nd Floor: - PV Lab - Diagnostic Testing (echo, CT, nuclear med)  3rd Floor: - Vacant  4th Floor: - TCTS (cardiothoracic surgery) - AFib Clinic - Structural Heart Clinic - Vascular Surgery  - Vascular Ultrasound  5th Floor: - [facility]Care [facility]  (general and EP) - Clinical Pharmacy for coumadin, hypertension, lipid, weight-loss medications, and med management appointments    Valet parking services will be available as well.

## 2023-11-17 NOTE — Progress Notes (Unsigned)
 Chief Complaint  Patient presents with   Follow-up    CAD, thoracic aortic aneurysm   History of Present Illness: 73 yo female with history ascending aortic aneurysm, mild aortic stenosis and mild CAD who is here today for follow up. I saw her as a new patient for the evaluation of chest pain on 09/20/22. She has had chest pain on/off over the past ten years. She was seen remotely by Dr. Elease Hashimoto. She was seen in the ED 09/17/22 with chest pian which was felt to be atypical for cardiac pain. Troponin negative x 2. EKG without ischemic changes. Chest CTA with no evidence of aortic dissection. 4.0 cm stable ascending aortic aneurysm. At her first visit here she described chest pain and nausea while cooking with radiation of pain into her right arm. The next day she had pain in her left arm. The following day she was dizzy and weak. Coronary CTA 09/28/22 with mild LAD stenosis. Calcium score of zero. Echo 10/06/22 with LVEF of 75%, mild aortic stenosis. 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 4.1 cm thoracic aortic aneurysm.   She is here today for follow up. The patient denies any chest pain, dyspnea, palpitations, lower extremity edema, orthopnea, PND, dizziness, near syncope or syncope. She is here today with her daughter in law who is a NP on our PCCM team.   Primary Care Physician: Roe Rutherford, NP   Past Medical History:  Diagnosis Date   Medical history non-contributory    No pertinent past medical history     Past Surgical History:  Procedure Laterality Date   CATARACT EXTRACTION [facility]  04/05/2011   Procedure: CATARACT EXTRACTION PHACO AND INTRAOCULAR LENS PLACEMENT (IOC);  Surgeon: Gemma Payor;  Location: [facility];  Service: Ophthalmology;  Laterality: Left;  CDE 11.84   CATARACT EXTRACTION W/PHACO  09/04/2012   Procedure: CATARACT EXTRACTION PHACO AND INTRAOCULAR LENS PLACEMENT (IOC);  Surgeon: Gemma Payor, MD;   Location: [facility];  Service: Ophthalmology;  Laterality: Right;  CDE=14.70   colonoscopy  12/25/2010   normal   GAS INSERTION Left 12/12/2014   Procedure: INSERTION OF GAS;  Surgeon: Edmon Crape, MD;  Location: [facility] OR;  Service: Ophthalmology;  Laterality: Left;   PARS PLANA VITRECTOMY Left 12/12/2014   Procedure: PARS PLANA VITRECTOMY WITH 25 GAUGE with endolaser ;  Surgeon: Edmon Crape, MD;  Location: [facility] OR;  Service: Ophthalmology;  Laterality: Left;  add on 5pm   TUBAL LIGATION     VARICOSE VEIN SURGERY      Current Outpatient Medications  Medication Sig Dispense Refill   aspirin EC 81 MG tablet Take 1 tablet (81 mg total) by mouth daily. Swallow whole. 90 tablet 3   Calcium Carbonate-Vitamin D (CALCIUM 600+D) 600-400 MG-UNIT per tablet Take 1 tablet by mouth 2 (two) times daily.       DENAVIR 1 % cream Apply 1 application topically as needed. Fever Blister     ibuprofen (ADVIL,MOTRIN) 200 MG tablet Take 200 mg by mouth every 6 (six) hours as needed. Pain      loratadine (CLARITIN) 10 MG tablet Take 10 mg by mouth daily as needed for allergies.      meloxicam (MOBIC) 15 MG tablet Take 15 mg by mouth as needed.     metoprolol succinate (TOPROL XL) 25 MG 24 hr tablet Take 1 tablet (25 mg total) by mouth daily. 90 tablet 3   Multiple Vitamin (MULTIVITAMIN  WITH MINERALS) TABS Take 1 tablet by mouth daily.     rosuvastatin (CRESTOR) 10 MG tablet Take 1 tablet (10 mg total) by mouth daily. 90 tablet 3   No current facility-administered medications for this visit.    No Known Allergies  Social History   Socioeconomic History   Marital status: Widowed    Spouse name: Not on file   Number of children: 2   Years of education: Not on file   Highest education level: Not on file  Occupational History   Occupation: Chief Strategy Officer at [employer]  Tobacco Use   Smoking status: Never   Smokeless tobacco: Never  Substance and Sexual Activity   Alcohol use: Yes     Alcohol/week: 7.0 standard drinks of alcohol    Types: 7 Glasses of wine per week   Drug use: No   Sexual activity: Yes    Birth control/protection: None  Other Topics Concern   Not on file  Social History Narrative   Not on file   Social Drivers of Health   Financial Resource Strain: Low Risk  (10/17/2023)   Received from [facility]   Overall Financial Resource Strain (CARDIA)    Difficulty of Paying Living Expenses: Not hard at all  Food Insecurity: No Food Insecurity (10/17/2023)   Received from [facility]   Hunger Vital Sign    Worried About Running Out of Food in the Last Year: Never true    Ran Out of Food in the Last Year: Never true  Transportation Needs: No Transportation Needs (10/17/2023)   Received from [facility] - Transportation    Lack of Transportation (Medical): No    Lack of Transportation (Non-Medical): No  Physical Activity: Sufficiently Active (10/17/2023)   Received from [facility]   Exercise Vital Sign    Days of Exercise per Week: 7 days    Minutes of Exercise per Session: 120 min  Stress: No Stress Concern Present (10/17/2023)   Received from [facility] - Occupational Stress Questionnaire    Feeling of Stress : Only a little  Social Connections: Socially Integrated (10/17/2023)   Received from [facility]   Social Network    How would you rate your social network (family, work, friends)?: Good participation with social networks  Intimate Partner Violence: Not At Risk (10/17/2023)   Received from [facility]   HITS    Over the last 12 months how often did your partner physically hurt you?: Never    Over the last 12 months how often did your partner insult you or talk down to you?: Never    Over the last 12 months how often did your partner threaten you with physical harm?: Never    Over the last 12 months how often did your partner scream or curse at you?: Never    Family History   Problem Relation Age of Onset   Diverticulosis Mother    Dementia Father    Anesthesia problems Neg Hx    Hypotension Neg Hx    Malignant hyperthermia Neg Hx    Pseudochol deficiency Neg Hx     Review of Systems:  As stated in the HPI and otherwise negative.   BP 132/80 (BP Location: Left Arm, Patient Position: Sitting, Cuff Size: Normal)   Pulse 78   Resp 16   Ht 5\' 2"  (1.575 m)   Wt 58.1 kg   SpO2 99%   BMI 23.41 kg/m  Physical Examination: General: Well developed, well nourished, NAD  HEENT: OP clear, mucus membranes moist  SKIN: warm, dry. No rashes. Neuro: No focal deficits  Musculoskeletal: Muscle strength 5/5 all ext  Psychiatric: Mood and affect normal  Neck: No JVD, no carotid bruits, no thyromegaly, no lymphadenopathy.  Lungs:Clear bilaterally, no wheezes, rhonci, crackles Cardiovascular: Regular rate and rhythm. No murmurs, gallops or rubs. Abdomen:Soft. Bowel sounds present. Non-tender.  Extremities: No lower extremity edema. Pulses are 2 + in the bilateral DP/PT.  EKG:  EKG is ordered today. The ekg ordered today demonstrates  EKG Interpretation Date/Time:  Thursday November 17 2023 16:16:42 EDT Ventricular Rate:  69 PR Interval:  114 QRS Duration:  76 QT Interval:  402 QTC Calculation: 430 R Axis:   -49  Text Interpretation: Normal sinus rhythm Left axis deviation Nonspecific ST abnormality Confirmed by Verne Carrow [phone]) on 11/17/2023 4:19:07 PM    Echo 10/06/22: 1. Left ventricular ejection fraction, by estimation, is 70 to 75%. The  left ventricle has hyperdynamic function. The left ventricle has no  regional wall motion abnormalities. Left ventricular diastolic parameters  are consistent with Grade I diastolic  dysfunction (impaired relaxation).   2. Right ventricular systolic function is normal. The right ventricular  size is normal. Tricuspid regurgitation signal is inadequate for assessing  PA pressure.   3. Left atrial size was  mildly dilated.   4. Right atrial size was moderately dilated.   5. The mitral valve is normal in structure. No evidence of mitral valve  regurgitation. No evidence of mitral stenosis.   6. The aortic valve is tricuspid. Aortic valve regurgitation is mild. No  aortic stenosis is present.   7. The inferior vena cava is normal in size with greater than 50%  respiratory variability, suggesting right atrial pressure of 3 mmHg.   Recent Labs: 01/04/2023: ALT 15   Lipid Panel    Component Value Date/Time   CHOL 154 01/04/2023 0913   TRIG 79 01/04/2023 0913   HDL 90 01/04/2023 0913   CHOLHDL 1.7 01/04/2023 0913   LDLCALC 49 01/04/2023 0913     Wt Readings from Last 3 Encounters:  11/17/23 58.1 kg  11/30/22 56.7 kg  11/01/22 57.2 kg    Assessment and Plan:   1. CAD without angina: Mild LAD plaque by coronary CTA in February 2024. No chest pain suggestive of angina. LDL 81 in primary care March 2025. Will increase Crestor to 10 mg daily. Continue ASA and statin.    2. Aortic insufficiency: Mild AI by echo in February 2024. Will repeat echo in one year.   3. Thoracic aortic aneurysm: 4.0 cm by CTA in February 2024. 4.1 cm by CTA at [facility] march 2025.   4. Varicose veins: Seen in [facility] by Dr. Chestine Spore  5. 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 Will arrange CTA head and neck to exclude aneurysmal changes. Will ask her to see Dr. Chestine Spore again in [facility] to discuss these findings noted on outside CTA. Will start Toprol 25 mg daily.   Labs/ tests ordered today include:   Orders Placed This Encounter  Procedures   CT ANGIO HEAD NECK W WO CM   Lipid Profile   Hepatic function panel   Ambulatory referral to Vascular Surgery   EKG 12-Lead   Disposition:   F/U with me in 12 months.    Signed, Verne Carrow, MD, [facility] 11/18/2023 7:30 AM  [facility] [address] [place],  [place], [place]  [address] Phone: [phone]; Fax: [phone]

## 2023-11-24 ENCOUNTER — Ambulatory Visit (HOSPITAL_BASED_OUTPATIENT_CLINIC_OR_DEPARTMENT_OTHER)
Admission: RE | Admit: 2023-11-24 | Discharge: 2023-11-24 | Disposition: A | Discharge status: Home / Self Care | Source: Ambulatory Visit | Attending: Cardiovascular Disease | Admitting: Cardiovascular Disease

## 2023-11-24 ENCOUNTER — Encounter (HOSPITAL_BASED_OUTPATIENT_CLINIC_OR_DEPARTMENT_OTHER): Payer: Self-pay

## 2023-11-24 DIAGNOSIS — I773 Arterial fibromuscular dysplasia: Secondary | ICD-10-CM | POA: Insufficient documentation

## 2023-11-24 MED ORDER — IOHEXOL 350 MG/ML SOLN
100.0000 mL | Freq: Once | INTRAVENOUS | Status: AC | PRN
  Administered 2023-11-24: 75 mL via INTRAVENOUS

## 2023-11-30 LAB — LIPID PANEL
Chol/HDL Ratio: 2.3 ratio (ref 0.0–4.4)
Cholesterol, Total: 158 mg/dL (ref 100–199)
HDL: 70 mg/dL (ref >39)
LDL Chol Calc (NIH): 72 mg/dL (ref 0–99)
Triglycerides: 88 mg/dL (ref 0–149)
VLDL Cholesterol Cal: 16 mg/dL (ref 5–40)

## 2023-11-30 LAB — HEPATIC FUNCTION PANEL
ALT: 16 IU/L (ref 0–32)
AST: 18 IU/L (ref 0–40)
Albumin: 4.2 g/dL (ref 3.8–4.8)
Alkaline Phosphatase: 75 IU/L (ref 44–121)
Bilirubin Total: 0.4 mg/dL (ref 0.0–1.2)
Bilirubin, Direct: 0.14 mg/dL (ref 0.00–0.40)
Total Protein: 6.2 g/dL (ref 6.0–8.5)

## 2023-12-05 NOTE — Progress Notes (Unsigned)
 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  Patient is referred by Dr. Abel Hoe with cardiology.  She has been seen in the past for varicose veins.   In review of the notes she had a CT chest to follow an ascending aortic aneurysm that measures 4.1 cm on 11/02/2023 showing a stable 6 mm distal splenic artery aneurysm and a beaded appearance to the mid distal right renal artery measuring up to 7 mm suspicious for FMD.  Patient denies any history of uncontrolled blood pressure.  Her blood pressure normally runs in the 120s on a single agent metoprolol .  Dr. Abel Hoe also ordered a CTA neck but she denies any history of stroke or TIA.   Past Medical History:  Diagnosis Date   Medical history non-contributory    No pertinent past medical history     Past Surgical History:  Procedure Laterality Date   CATARACT EXTRACTION W/PHACO  04/05/2011   Procedure: CATARACT EXTRACTION PHACO AND INTRAOCULAR LENS PLACEMENT (IOC);  Surgeon: Anner Kill;  Location: [facility];  Service: Ophthalmology;  Laterality: Left;  CDE 11.84   CATARACT EXTRACTION W/PHACO  09/04/2012   Procedure: CATARACT EXTRACTION PHACO AND INTRAOCULAR LENS PLACEMENT (IOC);  Surgeon: Anner Kill, MD;  Location: [facility];  Service: Ophthalmology;  Laterality: Right;  CDE=14.70   colonoscopy  12/25/2010   normal   GAS INSERTION Left 12/12/2014   Procedure: INSERTION OF GAS;  Surgeon: Shon Downing, MD;  Location: [facility] OR;  Service: Ophthalmology;  Laterality: Left;   PARS PLANA VITRECTOMY Left 12/12/2014   Procedure: PARS PLANA VITRECTOMY WITH 25 GAUGE with endolaser ;  Surgeon: Shon Downing, MD;  Location: [facility] OR;  Service: Ophthalmology;   Laterality: Left;  add on 5pm   TUBAL LIGATION     VARICOSE VEIN SURGERY      Family History  Problem Relation Age of Onset   Diverticulosis Mother    Dementia Father    Anesthesia problems Neg Hx    Hypotension Neg Hx    Malignant hyperthermia Neg Hx    Pseudochol deficiency Neg Hx     SOCIAL HISTORY: Social History   Socioeconomic History   Marital status: Widowed    Spouse name: Not on file   Number of children: 2   Years of education: Not on file   Highest education level: Not on file  Occupational History   Occupation: Chief Strategy Officer at [employer]  Tobacco Use   Smoking status: Never   Smokeless tobacco: Never  Substance and Sexual Activity   Alcohol use: Yes    Alcohol/week: 7.0 standard drinks of alcohol    Types: 7 Glasses of wine per week   Drug use: No   Sexual activity: Yes    Birth control/protection: None  Other Topics Concern   Not on file  Social History Narrative   Not on file   Social Drivers of Health   Financial Resource Strain: Low Risk  (10/17/2023)   Received from [facility]   Overall Financial Resource Strain (CARDIA)    Difficulty of Paying Living Expenses: Not hard at all  Food Insecurity: No Food Insecurity (10/17/2023)   Received from [facility]  Hunger Vital Sign    Worried About Running Out of Food in the Last Year: Never true    Ran Out of Food in the Last Year: Never true  Transportation Needs: No Transportation Needs (10/17/2023)   Received from [facility]   PRAPARE - Transportation    Lack of Transportation (Medical): No    Lack of Transportation (Non-Medical): No  Physical Activity: Sufficiently Active (10/17/2023)   Received from [facility]   Exercise Vital Sign    Days of Exercise per Week: 7 days    Minutes of Exercise per Session: 120 min  Stress: No Stress Concern Present (10/17/2023)   Received from [facility] - Occupational Stress  Questionnaire    Feeling of Stress : Only a little  Social Connections: Socially Integrated (10/17/2023)   Received from [facility]   Social Network    How would you rate your social network (family, work, friends)?: Good participation with social networks  Intimate Partner Violence: Not At Risk (10/17/2023)   Received from [facility]   HITS    Over the last 12 months how often did your partner physically hurt you?: Never    Over the last 12 months how often did your partner insult you or talk down to you?: Never    Over the last 12 months how often did your partner threaten you with physical harm?: Never    Over the last 12 months how often did your partner scream or curse at you?: Never    No Known Allergies  Current Outpatient Medications  Medication Sig Dispense Refill   aspirin  EC 81 MG tablet Take 1 tablet (81 mg total) by mouth daily. Swallow whole. 90 tablet 3   Calcium  Carbonate-Vitamin D (CALCIUM  600+D) 600-400 MG-UNIT per tablet Take 1 tablet by mouth 2 (two) times daily.       DENAVIR 1 % cream Apply 1 application topically as needed. Fever Blister     ibuprofen (ADVIL,MOTRIN) 200 MG tablet Take 200 mg by mouth every 6 (six) hours as needed. Pain      loratadine (CLARITIN) 10 MG tablet Take 10 mg by mouth daily as needed for allergies.      meloxicam (MOBIC) 15 MG tablet Take 15 mg by mouth as needed.     metoprolol  succinate (TOPROL  XL) 25 MG 24 hr tablet Take 1 tablet (25 mg total) by mouth daily. 90 tablet 3   Multiple Vitamin (MULTIVITAMIN WITH MINERALS) TABS Take 1 tablet by mouth daily.     rosuvastatin  (CRESTOR ) 10 MG tablet Take 1 tablet (10 mg total) by mouth daily. 90 tablet 3   No current facility-administered medications for this visit.    REVIEW OF SYSTEMS:  [X]  denotes positive finding, [ ]  denotes negative finding Cardiac  Comments:  Chest pain or chest pressure:    Shortness of breath upon exertion:    Short of breath when lying flat:    Irregular  heart rhythm:        Vascular    Pain in calf, thigh, or hip brought on by ambulation:    Pain in feet at night that wakes you up from your sleep:     Blood clot in your veins:    Leg swelling:         Pulmonary    Oxygen at home:    Productive cough:     Wheezing:         Neurologic  Sudden weakness in arms or legs:     Sudden numbness in arms or legs:     Sudden onset of difficulty speaking or slurred speech:    Temporary loss of vision in one eye:     Problems with dizziness:         Gastrointestinal    Blood in stool:     Vomited blood:         Genitourinary    Burning when urinating:     Blood in urine:        Psychiatric    Major depression:         Hematologic    Bleeding problems:    Problems with blood clotting too easily:        Skin    Rashes or ulcers:        Constitutional    Fever or chills:      PHYSICAL EXAM: There were no vitals filed for this visit.  GENERAL: The patient is a well-nourished female, in no acute distress. The vital signs are documented above. CARDIAC: There is a regular rate and rhythm.  VASCULAR:  PULMONARY: No respiratory distress ABDOMEN: Soft and non-tender MUSCULOSKELETAL: There are no major deformities or cyanosis. NEUROLOGIC: No focal weakness or paresthesias are detected. SKIN: There are no ulcers or rashes noted. PSYCHIATRIC: The patient has a normal affect.  DATA:   CT chest 11/02/23 to follow ascending aortic aneurysm that measures 4.1 cm also showing a stable 6 mm distal splenic artery aneurysm and a beaded appearance to the mid distal right renal artery measuring up to 7 mm suspicious for FMD.    Assessment/Plan:  73 y.o. 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  Patient is referred by Dr. Abel Hoe with cardiology.  CT was obtained to follow a known 4.1 cm ascending aortic  aneurysm.  I was able to review her images that does show a beaded appearance to the right renal artery suggestive of FMD.  I discussed this is nonatherosclerotic pathology.  She has no uncontrolled blood pressure that would warrant renal intervention.  I did review her CTA neck that Dr. Abel Hoe ordered and she has no evidence of significant carotid disease and her bifurcations are widely patent.  Still awaiting radiology read.  I will set up to see me in 6 months with renal duplex.  Discussed she let me know if she develops any uncontrolled blood pressure but otherwise we will just continue surveillance for now.   Young Hensen, MD [facility] Office: [phone]

## 2023-12-06 ENCOUNTER — Ambulatory Visit: Attending: Vascular Surgery | Admitting: Vascular Surgery

## 2023-12-06 ENCOUNTER — Encounter: Payer: Self-pay | Admitting: Vascular Surgery

## 2023-12-06 VITALS — BP 139/84 | HR 56 | Temp 98.1°F | Resp 18 | Ht 62.0 in | Wt 129.1 lb

## 2023-12-06 DIAGNOSIS — I773 Arterial fibromuscular dysplasia: Secondary | ICD-10-CM | POA: Insufficient documentation

## 2023-12-15 ENCOUNTER — Other Ambulatory Visit: Payer: Self-pay | Admitting: *Deleted

## 2023-12-15 DIAGNOSIS — I773 Arterial fibromuscular dysplasia: Secondary | ICD-10-CM

## 2023-12-21 ENCOUNTER — Ambulatory Visit: Payer: Self-pay

## 2024-01-04 ENCOUNTER — Telehealth: Payer: Self-pay | Admitting: Cardiovascular Disease

## 2024-01-04 NOTE — Telephone Encounter (Signed)
 Spoke to patient results given for recent ct of head and neck.Advised good blood pressure control.Results will be sent to [facility] in Vascular surgery.

## 2024-01-04 NOTE — Telephone Encounter (Signed)
 Pt returning call to a nurse for results

## 2024-05-22 ENCOUNTER — Ambulatory Visit: Admitting: Vascular Surgery

## 2024-05-22 ENCOUNTER — Encounter (HOSPITAL_COMMUNITY)

## 2024-06-01 LAB — COLOGUARD: COLOGUARD: POSITIVE — AB

## 2024-06-04 ENCOUNTER — Encounter: Payer: Self-pay | Admitting: Internal Medicine

## 2024-06-18 NOTE — Progress Notes (Unsigned)
 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  Patient is referred by Dr. Verlin with cardiology.  She has been seen in the past for varicose veins.   In review of the notes she had a CT chest to follow an ascending aortic aneurysm that measures 4.1 cm on 11/02/2023 showing a stable 6 mm distal splenic artery aneurysm and a beaded appearance to the mid distal right renal artery measuring up to 7 mm suspicious for FMD.  Today she has no new complaints.  She did stop her metoprolol  as she states it made her feel bad.  Blood pressures have been less than 120 systolic.  No history of stroke or TIA.   Past Medical History:  Diagnosis Date   Hypertension    Medical history non-contributory    No pertinent past medical history    Peripheral vascular disease     Past Surgical History:  Procedure Laterality Date   CATARACT EXTRACTION W/PHACO  04/05/2011   Procedure: CATARACT EXTRACTION PHACO AND INTRAOCULAR LENS PLACEMENT (IOC);  Surgeon: Cherene Mania;  Location: [facility];  Service: Ophthalmology;  Laterality: Left;  CDE 11.84   CATARACT EXTRACTION W/PHACO  09/04/2012   Procedure: CATARACT EXTRACTION PHACO AND INTRAOCULAR LENS PLACEMENT (IOC);  Surgeon: Cherene Mania, MD;  Location: [facility];  Service: Ophthalmology;  Laterality: Right;  CDE=14.70   colonoscopy  12/25/2010   normal   GAS INSERTION Left 12/12/2014   Procedure: INSERTION OF GAS;  Surgeon: Arley DELENA Ruder, MD;  Location: [facility] OR;  Service: Ophthalmology;  Laterality: Left;   PARS PLANA VITRECTOMY Left 12/12/2014   Procedure: PARS PLANA VITRECTOMY WITH 25 GAUGE with endolaser ;  Surgeon: Arley DELENA Ruder, MD;  Location: [facility]  OR;  Service: Ophthalmology;  Laterality: Left;  add on 5pm   TUBAL LIGATION     VARICOSE VEIN SURGERY      Family History  Problem Relation Age of Onset   Diverticulosis Mother    Dementia Father    Anesthesia problems Neg Hx    Hypotension Neg Hx    Malignant hyperthermia Neg Hx    Pseudochol deficiency Neg Hx     SOCIAL HISTORY: Social History   Socioeconomic History   Marital status: Widowed    Spouse name: Not on file   Number of children: 2   Years of education: Not on file   Highest education level: Not on file  Occupational History   Occupation: Chief Strategy Officer at [employer]  Tobacco Use   Smoking status: Never   Smokeless tobacco: Never  Vaping Use   Vaping status: Never Used  Substance and Sexual Activity   Alcohol use: Yes    Alcohol/week: 7.0 standard drinks of alcohol    Types: 7 Glasses of wine per week   Drug use: No   Sexual activity: Yes    Birth control/protection: None  Other Topics Concern   Not on file  Social History Narrative   Not on file   Social Drivers of Health   Financial Resource Strain: Low Risk  (04/29/2024)   Received from [facility]   Overall Financial Resource Strain (CARDIA)    How hard  is it for you to pay for the very basics like food, housing, medical care, and heating?: Not hard at all  Food Insecurity: No Food Insecurity (04/29/2024)   Received from [facility]   Hunger Vital Sign    Within the past 12 months, you worried that your food would run out before you got the money to buy more.: Never true    Within the past 12 months, the food you bought just didn't last and you didn't have money to get more.: Never true  Transportation Needs: No Transportation Needs (04/29/2024)   Received from [facility] - Transportation    In the past 12 months, has lack of transportation kept you from medical appointments or from getting medications?: No    In the past 12 months, has lack of  transportation kept you from meetings, work, or from getting things needed for daily living?: No  Physical Activity: Sufficiently Active (04/29/2024)   Received from [facility]   Exercise Vital Sign    On average, how many days per week do you engage in moderate to strenuous exercise (like a brisk walk)?: 7 days    On average, how many minutes do you engage in exercise at this level?: 60 min  Stress: No Stress Concern Present (04/29/2024)   Received from [facility] - Occupational Stress Questionnaire    Do you feel stress - tense, restless, nervous, or anxious, or unable to sleep at night because your mind is troubled all the time - these days?: Not at all  Social Connections: Socially Integrated (04/29/2024)   Received from [facility]   Social Network    How would you rate your social network (family, work, friends)?: Good participation with social networks  Intimate Partner Violence: Not At Risk (04/29/2024)   Received from [facility]   HITS    Over the last 12 months how often did your partner physically hurt you?: Never    Over the last 12 months how often did your partner insult you or talk down to you?: Never    Over the last 12 months how often did your partner threaten you with physical harm?: Never    Over the last 12 months how often did your partner scream or curse at you?: Never    No Known Allergies  Current Outpatient Medications  Medication Sig Dispense Refill   aspirin  EC 81 MG tablet Take 1 tablet (81 mg total) by mouth daily. Swallow whole. 90 tablet 3   Calcium  Carbonate-Vitamin D (CALCIUM  600+D) 600-400 MG-UNIT per tablet Take 1 tablet by mouth 2 (two) times daily.       DENAVIR 1 % cream Apply 1 application topically as needed. Fever Blister     ibuprofen (ADVIL,MOTRIN) 200 MG tablet Take 200 mg by mouth every 6 (six) hours as needed. Pain      loratadine (CLARITIN) 10 MG tablet Take 10 mg by mouth daily as needed  for allergies.      meloxicam (MOBIC) 15 MG tablet Take 15 mg by mouth as needed.     metoprolol  succinate (TOPROL  XL) 25 MG 24 hr tablet Take 1 tablet (25 mg total) by mouth daily. 90 tablet 3   Multiple Vitamin (MULTIVITAMIN WITH MINERALS) TABS Take 1 tablet by mouth daily.     rosuvastatin  (CRESTOR ) 10 MG tablet Take 1 tablet (10 mg total) by mouth daily. 90 tablet 3   No current facility-administered medications for  this visit.    REVIEW OF SYSTEMS:  [X]  denotes positive finding, [ ]  denotes negative finding Cardiac  Comments:  Chest pain or chest pressure:    Shortness of breath upon exertion:    Short of breath when lying flat:    Irregular heart rhythm:        Vascular    Pain in calf, thigh, or hip brought on by ambulation:    Pain in feet at night that wakes you up from your sleep:     Blood clot in your veins:    Leg swelling:         Pulmonary    Oxygen at home:    Productive cough:     Wheezing:         Neurologic    Sudden weakness in arms or legs:     Sudden numbness in arms or legs:     Sudden onset of difficulty speaking or slurred speech:    Temporary loss of vision in one eye:     Problems with dizziness:         Gastrointestinal    Blood in stool:     Vomited blood:         Genitourinary    Burning when urinating:     Blood in urine:        Psychiatric    Major depression:         Hematologic    Bleeding problems:    Problems with blood clotting too easily:        Skin    Rashes or ulcers:        Constitutional    Fever or chills:      PHYSICAL EXAM: There were no vitals filed for this visit.  GENERAL: The patient is a well-nourished female, in no acute distress. The vital signs are documented above. CARDIAC: There is a regular rate and rhythm.  VASCULAR:  Bilateral radial pulses palpable Bilateral femoral pulses palpable Bilateral PT pulses palpable PULMONARY: No respiratory distress ABDOMEN: Soft and non-tender MUSCULOSKELETAL:  There are no major deformities or cyanosis. NEUROLOGIC: No focal weakness or paresthesias are detected. SKIN: There are no ulcers or rashes noted. PSYCHIATRIC: The patient has a normal affect.  DATA:   Renal artery duplex shows no significant renal artery stenosis bilaterally  CT chest 11/02/23 to follow ascending aortic aneurysm that measures 4.1 cm also showing a stable 6 mm distal splenic artery aneurysm and a beaded appearance to the mid distal right renal artery measuring up to 7 mm suspicious for FMD.   Assessment/Plan:  73 y.o. 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  Patient is referred by Dr. Verlin with cardiology.  CT was obtained to follow a known 4.1 cm ascending aortic aneurysm.  Again discussed that FMD is a nonatherosclerotic pathology for vascular disease that can lead to narrowing and aneurysm formation.  Fortunately today her renal artery duplex shows no significant stenosis bilaterally.  She also has well-controlled blood pressures at home and discussed FMD of the renal arteries can sometimes cause uncontrolled blood pressure if a significant narrowing develops.  Otherwise had a CTA neck earlier this year that showed some mild changes in the ICAs that are very subtle and I do not see really any significant pathology.  Discussed continued surveillance.    I will see her in  1 year with carotid and renal artery duplex for ongoing surveillance.  We can also follow small 6 mm splenic artery aneurysm that requires no intervention and has been stable in the past.   Lonni DOROTHA Gaskins, MD [facility]: [phone]

## 2024-06-19 ENCOUNTER — Encounter: Payer: Self-pay | Admitting: Vascular Surgery

## 2024-06-19 ENCOUNTER — Ambulatory Visit: Admitting: Vascular Surgery

## 2024-06-19 ENCOUNTER — Ambulatory Visit (HOSPITAL_COMMUNITY)
Admission: RE | Admit: 2024-06-19 | Discharge: 2024-06-19 | Disposition: A | Discharge status: Home / Self Care | Source: Ambulatory Visit | Attending: Vascular Surgery | Admitting: Vascular Surgery

## 2024-06-19 VITALS — BP 157/81 | HR 60 | Temp 97.6°F | Resp 16 | Ht 62.0 in | Wt 130.2 lb

## 2024-06-19 DIAGNOSIS — I773 Arterial fibromuscular dysplasia: Secondary | ICD-10-CM | POA: Insufficient documentation

## 2024-07-11 ENCOUNTER — Encounter: Payer: Self-pay | Admitting: Internal Medicine

## 2024-07-11 ENCOUNTER — Ambulatory Visit: Admitting: Internal Medicine

## 2024-07-11 VITALS — BP 139/85 | HR 65 | Temp 97.8°F | Ht 62.5 in | Wt 132.7 lb

## 2024-07-11 DIAGNOSIS — R195 Other fecal abnormalities: Secondary | ICD-10-CM

## 2024-07-11 NOTE — Patient Instructions (Signed)
 We will schedule you for colonoscopy in January to further evaluate your recent positive Cologuard testing.  It was very nice meeting you today.  I hope you have a fantastic holiday season.  Dr. Cindie

## 2024-07-11 NOTE — Progress Notes (Signed)
 Primary Care Physician:  Suanne Pfeiffer, NP Primary Gastroenterologist:  Dr. Cindie  Chief Complaint  Patient presents with   Positive Cologuard    Pt arrives due to positive cologuard about one month ago. Pt took 2-one was negative and one month later test was positive. Has had TCS in past. Pt reports no issues.     HPI:   Amanda Vang is a 73 y.o. female who presents to the clinic today by her PCP Pfeiffer Suanne for evaluation for positive Cologuard testing.  Last colonoscopy 06/30/2001 WNL.  No family history of colorectal malignancy.  No melena hematochezia.  No abdominal pain or unintentional weight loss.  Patient denies any upper GI symptoms including heartburn, reflux, dysphagia/odynophagia, epigastric or chest pain.   Past Medical History:  Diagnosis Date   Hypertension    Medical history non-contributory    No pertinent past medical history    Peripheral vascular disease     Past Surgical History:  Procedure Laterality Date   CATARACT EXTRACTION W/PHACO  04/05/2011   Procedure: CATARACT EXTRACTION PHACO AND INTRAOCULAR LENS PLACEMENT (IOC);  Surgeon: Cherene Mania;  Location: [facility];  Service: Ophthalmology;  Laterality: Left;  CDE 11.84   CATARACT EXTRACTION W/PHACO  09/04/2012   Procedure: CATARACT EXTRACTION PHACO AND INTRAOCULAR LENS PLACEMENT (IOC);  Surgeon: Cherene Mania, MD;  Location: [facility];  Service: Ophthalmology;  Laterality: Right;  CDE=14.70   colonoscopy  12/25/2010   normal   GAS INSERTION Left 12/12/2014   Procedure: INSERTION OF GAS;  Surgeon: Arley DELENA Ruder, MD;  Location: [facility] OR;  Service: Ophthalmology;  Laterality: Left;   PARS PLANA VITRECTOMY Left 12/12/2014   Procedure: PARS PLANA VITRECTOMY WITH 25 GAUGE with endolaser ;  Surgeon: Arley DELENA Ruder, MD;  Location: [facility] OR;  Service: Ophthalmology;  Laterality: Left;  add on 5pm   TUBAL LIGATION     VARICOSE VEIN SURGERY      Current Outpatient Medications  Medication Sig Dispense Refill    Multiple Vitamin (MULTIVITAMIN WITH MINERALS) TABS Take 1 tablet by mouth daily.     rosuvastatin  (CRESTOR ) 10 MG tablet Take 1 tablet (10 mg total) by mouth daily. 90 tablet 3   aspirin  EC 81 MG tablet Take 1 tablet (81 mg total) by mouth daily. Swallow whole. 90 tablet 3   Calcium  Carbonate-Vitamin D (CALCIUM  600+D) 600-400 MG-UNIT per tablet Take 1 tablet by mouth 2 (two) times daily.       DENAVIR 1 % cream Apply 1 application topically as needed. Fever Blister     ibuprofen (ADVIL,MOTRIN) 200 MG tablet Take 200 mg by mouth every 6 (six) hours as needed. Pain      loratadine (CLARITIN) 10 MG tablet Take 10 mg by mouth daily as needed for allergies.      meloxicam (MOBIC) 15 MG tablet Take 15 mg by mouth as needed.     metoprolol  succinate (TOPROL  XL) 25 MG 24 hr tablet Take 1 tablet (25 mg total) by mouth daily. (Patient not taking: Reported on 07/11/2024) 90 tablet 3   No current facility-administered medications for this visit.    Allergies as of 07/11/2024   (No Known Allergies)    Family History  Problem Relation Age of Onset   Diverticulosis Mother    Dementia Father    Anesthesia problems Neg Hx    Hypotension Neg Hx    Malignant hyperthermia Neg Hx    Pseudochol deficiency Neg Hx     Social History  Socioeconomic History   Marital status: Widowed    Spouse name: Not on file   Number of children: 2   Years of education: Not on file   Highest education level: Not on file  Occupational History   Occupation: Chief Strategy Officer at [employer]  Tobacco Use   Smoking status: Never   Smokeless tobacco: Never  Vaping Use   Vaping status: Never Used  Substance and Sexual Activity   Alcohol use: Yes    Alcohol/week: 7.0 standard drinks of alcohol    Types: 7 Glasses of wine per week   Drug use: No   Sexual activity: Yes    Birth control/protection: None  Other Topics Concern   Not on file  Social History Narrative   Not on file   Social Drivers of  Health   Financial Resource Strain: Low Risk  (04/29/2024)   Received from [facility]   Overall Financial Resource Strain (CARDIA)    How hard is it for you to pay for the very basics like food, housing, medical care, and heating?: Not hard at all  Food Insecurity: No Food Insecurity (04/29/2024)   Received from [facility]   Hunger Vital Sign    Within the past 12 months, you worried that your food would run out before you got the money to buy more.: Never true    Within the past 12 months, the food you bought just didn't last and you didn't have money to get more.: Never true  Transportation Needs: No Transportation Needs (04/29/2024)   Received from [facility] - Transportation    In the past 12 months, has lack of transportation kept you from medical appointments or from getting medications?: No    In the past 12 months, has lack of transportation kept you from meetings, work, or from getting things needed for daily living?: No  Physical Activity: Sufficiently Active (04/29/2024)   Received from [facility]   Exercise Vital Sign    On average, how many days per week do you engage in moderate to strenuous exercise (like a brisk walk)?: 7 days    On average, how many minutes do you engage in exercise at this level?: 60 min  Stress: No Stress Concern Present (04/29/2024)   Received from [facility] of Occupational Health - Occupational Stress Questionnaire    Do you feel stress - tense, restless, nervous, or anxious, or unable to sleep at night because your mind is troubled all the time - these days?: Not at all  Social Connections: Socially Integrated (04/29/2024)   Received from [facility]   Social Network    How would you rate your social network (family, work, friends)?: Good participation with social networks  Intimate Partner Violence: Not At Risk (04/29/2024)   Received from [facility]   HITS    Over the last 12 months how often did your  partner physically hurt you?: Never    Over the last 12 months how often did your partner insult you or talk down to you?: Never    Over the last 12 months how often did your partner threaten you with physical harm?: Never    Over the last 12 months how often did your partner scream or curse at you?: Never    Subjective: Review of Systems  Constitutional:  Negative for chills and fever.  HENT:  Negative for congestion and hearing loss.   Eyes:  Negative for blurred vision and  double vision.  Respiratory:  Negative for cough and shortness of breath.   Cardiovascular:  Negative for chest pain and palpitations.  Gastrointestinal:  Negative for abdominal pain, blood in stool, constipation, diarrhea, heartburn, melena and vomiting.  Genitourinary:  Negative for dysuria and urgency.  Musculoskeletal:  Negative for joint pain and myalgias.  Skin:  Negative for itching and rash.  Neurological:  Negative for dizziness and headaches.  Psychiatric/Behavioral:  Negative for depression. The patient is not nervous/anxious.        Objective: BP 139/85   Pulse 65   Temp 97.8 F (36.6 C)   Ht 5' 2.5 (1.588 m)   Wt 132 lb 11.2 oz (60.2 kg)   BMI 23.88 kg/m  Physical Exam Constitutional:      Appearance: Normal appearance.  HENT:     Head: Normocephalic and atraumatic.  Eyes:     Extraocular Movements: Extraocular movements intact.     Conjunctiva/sclera: Conjunctivae normal.  Cardiovascular:     Rate and Rhythm: Normal rate and regular rhythm.  Pulmonary:     Effort: Pulmonary effort is normal.     Breath sounds: Normal breath sounds.  Abdominal:     General: Bowel sounds are normal.     Palpations: Abdomen is soft.  Musculoskeletal:        General: No swelling. Normal range of motion.     Cervical back: Normal range of motion and neck supple.  Skin:    General: Skin is warm and dry.     Coloration: Skin is not jaundiced.  Neurological:     General: No focal deficit present.      Mental Status: She is alert and oriented to person, place, and time.  Psychiatric:        Mood and Affect: Mood normal.        Behavior: Behavior normal.      Assessment: *Positive Cologuard testing  Plan: Will schedule for screening colonoscopy.The risks including infection, bleed, or perforation as well as benefits, limitations, alternatives and imponderables have been reviewed with the patient. Questions have been answered. All parties agreeable.  ASA 3 given patient's history of AAA.  Thank you Courtney Keatts for the kind referral.  07/11/2024 11:20 AM

## 2024-07-26 ENCOUNTER — Telehealth: Payer: Self-pay | Admitting: *Deleted

## 2024-07-26 MED ORDER — NA SULFATE-K SULFATE-MG SULF 17.5-3.13-1.6 GM/177ML PO SOLN: 1.0000 | ORAL | 0 refills | Status: DC

## 2024-07-26 NOTE — Telephone Encounter (Signed)
 Spoke with pt. She has been scheduled for TCS with Dr. Cindie, ASA 3 08/24/24. She wanted a low volume prep. Advised will send rx and will mail instructions to her. Advised will also get a pre-op phone call with her arrival time a few days prior to procedure.

## 2024-08-22 ENCOUNTER — Encounter (HOSPITAL_COMMUNITY): Payer: Self-pay

## 2024-08-22 ENCOUNTER — Encounter (HOSPITAL_COMMUNITY)
Admission: RE | Admit: 2024-08-22 | Discharge: 2024-08-22 | Disposition: A | Discharge status: Home / Self Care | Source: Ambulatory Visit | Attending: Internal Medicine | Admitting: Internal Medicine

## 2024-08-22 ENCOUNTER — Telehealth (INDEPENDENT_AMBULATORY_CARE_PROVIDER_SITE_OTHER): Payer: Self-pay

## 2024-08-22 HISTORY — DX: Aortic aneurysm of unspecified site, without rupture: I71.9

## 2024-08-22 NOTE — Telephone Encounter (Signed)
 Patient left vm, I ATC pt back but the phone said that my call cannot be completed at this time (x2).

## 2024-08-22 NOTE — Pre-Procedure Instructions (Signed)
 PAT visit completed with patient in person at [facility].

## 2024-08-24 ENCOUNTER — Ambulatory Visit (HOSPITAL_COMMUNITY): Admitting: Anesthesiology

## 2024-08-24 ENCOUNTER — Encounter (HOSPITAL_COMMUNITY): Admission: RE | Disposition: A | Payer: Self-pay | Source: Home / Self Care | Attending: Internal Medicine

## 2024-08-24 ENCOUNTER — Ambulatory Visit (HOSPITAL_COMMUNITY)
Admission: RE | Admit: 2024-08-24 | Discharge: 2024-08-24 | Disposition: A | Discharge status: Home / Self Care | Attending: Internal Medicine | Admitting: Internal Medicine

## 2024-08-24 ENCOUNTER — Encounter (HOSPITAL_COMMUNITY): Payer: Self-pay | Admitting: Internal Medicine

## 2024-08-24 DIAGNOSIS — Z1211 Encounter for screening for malignant neoplasm of colon: Secondary | ICD-10-CM | POA: Diagnosis present

## 2024-08-24 DIAGNOSIS — K648 Other hemorrhoids: Secondary | ICD-10-CM | POA: Diagnosis not present

## 2024-08-24 DIAGNOSIS — K5730 Diverticulosis of large intestine without perforation or abscess without bleeding: Secondary | ICD-10-CM | POA: Insufficient documentation

## 2024-08-24 DIAGNOSIS — R195 Other fecal abnormalities: Secondary | ICD-10-CM | POA: Insufficient documentation

## 2024-08-24 DIAGNOSIS — I739 Peripheral vascular disease, unspecified: Secondary | ICD-10-CM | POA: Diagnosis not present

## 2024-08-24 DIAGNOSIS — D123 Benign neoplasm of transverse colon: Secondary | ICD-10-CM | POA: Insufficient documentation

## 2024-08-24 DIAGNOSIS — I10 Essential (primary) hypertension: Secondary | ICD-10-CM | POA: Diagnosis not present

## 2024-08-24 HISTORY — PX: POLYPECTOMY: SHX149

## 2024-08-24 HISTORY — PX: HEMOSTASIS CLIP PLACEMENT: SHX6857

## 2024-08-24 HISTORY — PX: COLONOSCOPY: SHX5424

## 2024-08-24 MED ORDER — LACTATED RINGERS IV SOLN: INTRAVENOUS | Status: DC

## 2024-08-24 MED ORDER — PROPOFOL 500 MG/50ML IV EMUL
INTRAVENOUS | Status: DC | PRN
  Administered 2024-08-24: 150 ug/kg/min via INTRAVENOUS
  Administered 2024-08-24: 100 mg via INTRAVENOUS

## 2024-08-24 MED ORDER — SPOT INK MARKER SYRINGE KIT
PACK | SUBMUCOSAL | Status: DC | PRN
  Administered 2024-08-24: 1.5 mL via SUBMUCOSAL

## 2024-08-24 NOTE — Op Note (Signed)
 [facility] Patient Name: Amanda Vang Procedure Date: 08/24/2024 9:24 AM MRN: [id number] Date of Birth: 08-07-1951 Attending MD: Carlin POUR. Cindie , [place], [id number] CSN: [id number] Age: 74 Admit Type: Outpatient Procedure:                Colonoscopy Indications:              Screening for colorectal malignant neoplasm,                            Incidental - Positive Cologuard test Providers:                Carlin POUR. Cindie, DO, Jon LABOR. Gerome RN, RN,                            Olam Ada, RN Referring MD:              Medicines:                See the Anesthesia note for documentation of the                            administered medications Complications:            No immediate complications. Estimated Blood Loss:     Estimated blood loss was minimal. Procedure:                Pre-Anesthesia Assessment:                           - The anesthesia plan was to use monitored                            anesthesia care (MAC).                           After obtaining informed consent, the colonoscope                            was passed under direct vision. Throughout the                            procedure, the patient's blood pressure, pulse, and                            oxygen saturations were monitored continuously. The                            PCF-HQ190L ([id number]) Peds Colon was introduced                            through the anus and advanced to the the cecum,                            identified by appendiceal orifice and ileocecal                            valve. The colonoscopy was performed  without                            difficulty. The patient tolerated the procedure                            well. The quality of the bowel preparation was                            evaluated using the BBPS [facility] Bowel Preparation                            Scale) with scores of: Right Colon = 3, Transverse                            Colon = 3 and Left Colon = 3  (entire mucosa seen                            well with no residual staining, small fragments of                            stool or opaque liquid). The total BBPS score                            equals 9. Scope In: 9:32:02 AM Scope Out: 9:58:09 AM Scope Withdrawal Time: 0 hours 22 minutes 21 seconds  Total Procedure Duration: 0 hours 26 minutes 7 seconds  Findings:      Non-bleeding internal hemorrhoids were found.      Multiple large-mouthed and small-mouthed diverticula were found in the       sigmoid colon.      A 20 to 25 mm polyp was found in the proximal transverse colon. The       polyp was multi-lobulated and semi-pedunculated. The polyp was removed       with a piecemeal technique using a hot snare. Resection and retrieval       were complete with Madelyn net. Edges were cauterized with snare tip. To       prevent bleeding after the polypectomy, three hemostatic clips were       successfully placed (MR safe). Clip manufacturer: [employer].       There was no bleeding at the end of the procedure. Area distal was       tattooed with an injection of 1 mL of Spot (carbon black). Impression:               - Non-bleeding internal hemorrhoids.                           - Diverticulosis in the sigmoid colon.                           - One 20 to 25 mm polyp in the proximal transverse                            colon, removed piecemeal using a hot snare.  Resected and retrieved. Clip manufacturer: [employer]. Clips (MR safe) were placed. Tattooed. Moderate Sedation:      Per Anesthesia Care Recommendation:           - Patient has a contact number available for                            emergencies. The signs and symptoms of potential                            delayed complications were discussed with the                            patient. Return to normal activities tomorrow.                            Written  discharge instructions were provided to the                            patient.                           - Resume previous diet.                           - Continue present medications.                           - Await pathology results.                           - Repeat colonoscopy in 6 months for surveillance.                           - Return to GI clinic in 3 months. Procedure Code(s):        --- Professional ---                           [phone], Colonoscopy, flexible; with removal of                            tumor(s), polyp(s), or other lesion(s) by snare                            technique                           45381, Colonoscopy, flexible; with directed                            submucosal injection(s), any substance Diagnosis Code(s):        --- Professional ---                           Z12.11, Encounter for screening  for malignant                            neoplasm of colon                           K64.8, Other hemorrhoids                           D12.3, Benign neoplasm of transverse colon (hepatic                            flexure or splenic flexure)                           K57.30, Diverticulosis of large intestine without                            perforation or abscess without bleeding CPT copyright 2022 American Medical Association. All rights reserved. The codes documented in this report are preliminary and upon coder review may  be revised to meet current compliance requirements. Carlin POUR. Cindie, DO Carlin POUR. Cindie, DO 08/24/2024 10:07:18 AM This report has been signed electronically. Number of Addenda: 0

## 2024-08-24 NOTE — Anesthesia Preprocedure Evaluation (Signed)
"                                    Anesthesia Evaluation  Patient identified by MRN, date of birth, ID band Patient awake    Reviewed: Allergy & Precautions, H&P , NPO status , Patient's Chart, lab work & pertinent test results, reviewed documented beta blocker date and time   Airway Mallampati: II  TM Distance: >3 FB Neck ROM: full    Dental no notable dental hx.    Pulmonary neg pulmonary ROS   Pulmonary exam normal breath sounds clear to auscultation       Cardiovascular Exercise Tolerance: Good hypertension, + Peripheral Vascular Disease   Rhythm:regular Rate:Normal     Neuro/Psych negative neurological ROS  negative psych ROS   GI/Hepatic negative GI ROS, Neg liver ROS,,,  Endo/Other  negative endocrine ROS    Renal/GU negative Renal ROS  negative genitourinary   Musculoskeletal   Abdominal   Peds  Hematology negative hematology ROS (+)   Anesthesia Other Findings   Reproductive/Obstetrics negative OB ROS                              Anesthesia Physical Anesthesia Plan  ASA: 2  Anesthesia Plan: MAC   Post-op Pain Management:    Induction:   PONV Risk Score and Plan: Propofol  infusion  Airway Management Planned:   Additional Equipment:   Intra-op Plan:   Post-operative Plan:   Informed Consent: I have reviewed the patients History and Physical, chart, labs and discussed the procedure including the risks, benefits and alternatives for the proposed anesthesia with the patient or authorized representative who has indicated his/her understanding and acceptance.     Dental Advisory Given  Plan Discussed with: CRNA  Anesthesia Plan Comments:         Anesthesia Quick Evaluation  "

## 2024-08-24 NOTE — H&P (Signed)
 " Primary Care Physician:  Suanne Pfeiffer, NP Primary Gastroenterologist:  Dr. Cindie  Pre-Procedure History & Physical: HPI:  Amanda Vang is a 74 y.o. female is here  for a colonoscopy to be performed for colon cancer screening purposes, positive Cologuard testing. Past Medical History:  Diagnosis Date   Aorta aneurysm    Hypertension    Medical history non-contributory    No pertinent past medical history    Peripheral vascular disease     Past Surgical History:  Procedure Laterality Date   CATARACT EXTRACTION W/PHACO  04/05/2011   Procedure: CATARACT EXTRACTION PHACO AND INTRAOCULAR LENS PLACEMENT (IOC);  Surgeon: Cherene Mania;  Location: [facility];  Service: Ophthalmology;  Laterality: Left;  CDE 11.84   CATARACT EXTRACTION W/PHACO  09/04/2012   Procedure: CATARACT EXTRACTION PHACO AND INTRAOCULAR LENS PLACEMENT (IOC);  Surgeon: Cherene Mania, MD;  Location: [facility];  Service: Ophthalmology;  Laterality: Right;  CDE=14.70   colonoscopy  12/25/2010   normal   GAS INSERTION Left 12/12/2014   Procedure: INSERTION OF GAS;  Surgeon: Arley DELENA Ruder, MD;  Location: [facility] OR;  Service: Ophthalmology;  Laterality: Left;   PARS PLANA VITRECTOMY Left 12/12/2014   Procedure: PARS PLANA VITRECTOMY WITH 25 GAUGE with endolaser ;  Surgeon: Arley DELENA Ruder, MD;  Location: [facility] OR;  Service: Ophthalmology;  Laterality: Left;  add on 5pm   TUBAL LIGATION     VARICOSE VEIN SURGERY      Prior to Admission medications  Medication Sig Start Date End Date Taking? Authorizing Provider  aspirin  EC 81 MG tablet Take 1 tablet (81 mg total) by mouth daily. Swallow whole. 10/05/22  Yes Verlin Lonni BIRCH, MD  Calcium  Carbonate-Vitamin D (CALCIUM  600+D) 600-400 MG-UNIT per tablet Take 1 tablet by mouth 2 (two) times daily.     Yes [provider]  DENAVIR 1 % cream Apply 1 application topically as needed. Fever Blister 09/24/11  Yes [provider]  loratadine (CLARITIN) 10 MG tablet Take 10 mg by  mouth daily as needed for allergies.    Yes [provider]  meloxicam (MOBIC) 15 MG tablet Take 15 mg by mouth as needed.   Yes [provider]  Multiple Vitamin (MULTIVITAMIN WITH MINERALS) TABS Take 1 tablet by mouth daily.   Yes [provider]  Na Sulfate-K Sulfate-Mg Sulfate concentrate (SUPREP) 17.5-3.13-1.6 GM/177ML SOLN Take 1 kit (354 mLs total) by mouth as directed. 07/26/24  Yes Donatella Walski K, DO  ibuprofen (ADVIL,MOTRIN) 200 MG tablet Take 200 mg by mouth every 6 (six) hours as needed. Pain     [provider]  metoprolol  succinate (TOPROL  XL) 25 MG 24 hr tablet Take 1 tablet (25 mg total) by mouth daily. 11/17/23   Verlin Lonni BIRCH, MD  rosuvastatin  (CRESTOR ) 10 MG tablet Take 1 tablet (10 mg total) by mouth daily. 11/17/23 07/11/24  Verlin Lonni BIRCH, MD    Allergies as of 07/26/2024   (No Known Allergies)    Family History  Problem Relation Age of Onset   Diverticulosis Mother    Dementia Father    Anesthesia problems Neg Hx    Hypotension Neg Hx    Malignant hyperthermia Neg Hx    Pseudochol deficiency Neg Hx     Social History   Socioeconomic History   Marital status: Widowed    Spouse name: Not on file   Number of children: 2   Years of education: Not on file   Highest education level: Not on  file  Occupational History   Occupation: Chief Strategy Officer at [employer]  Tobacco Use   Smoking status: Never   Smokeless tobacco: Never  Vaping Use   Vaping status: Never Used  Substance and Sexual Activity   Alcohol use: Not Currently   Drug use: No   Sexual activity: Yes    Birth control/protection: None  Other Topics Concern   Not on file  Social History Narrative   Not on file   Social Drivers of Health   Tobacco Use: Low Risk (08/24/2024)   Patient History    Smoking Tobacco Use: Never    Smokeless Tobacco Use: Never    Passive Exposure: Not on file  Financial Resource Strain: Low  Risk (04/29/2024)   Received from [facility]   Overall Financial Resource Strain (CARDIA)    How hard is it for you to pay for the very basics like food, housing, medical care, and heating?: Not hard at all  Food Insecurity: No Food Insecurity (04/29/2024)   Received from [facility]   Epic    Within the past 12 months, you worried that your food would run out before you got the money to buy more.: Never true    Within the past 12 months, the food you bought just didn't last and you didn't have money to get more.: Never true  Transportation Needs: No Transportation Needs (04/29/2024)   [facility]    In the past 12 months, has lack of transportation kept you from medical appointments or from getting medications?: No    In the past 12 months, has lack of transportation kept you from meetings, work, or from getting things needed for daily living?: No  Physical Activity: Sufficiently Active (04/29/2024)   Received from [facility]   Exercise Vital Sign    On average, how many days per week do you engage in moderate to strenuous exercise (like a brisk walk)?: 7 days    On average, how many minutes do you engage in exercise at this level?: 60 min  Stress: No Stress Concern Present (04/29/2024)   Received from [facility] - Occupational Stress Questionnaire    Do you feel stress - tense, restless, nervous, or anxious, or unable to sleep at night because your mind is troubled all the time - these days?: Not at all  Social Connections: Socially Integrated (04/29/2024)   Received from [facility]   Social Network    How would you rate your social network (family, work, friends)?: Good participation with social networks  Intimate Partner Violence: Not At Risk (04/29/2024)   Received from [facility]   HITS    Over the last 12 months how often did your partner physically hurt you?: Never    Over the last 12 months how often  did your partner insult you or talk down to you?: Never    Over the last 12 months how often did your partner threaten you with physical harm?: Never    Over the last 12 months how often did your partner scream or curse at you?: Never  Depression (PHQ2-9): Not on file  Alcohol Screen: Not on file  Housing: Low Risk (04/29/2024)   Received from [facility]    In the last 12 months, was there a time when you were not able to pay the mortgage or rent on time?: No    In the past 12 months,  how many times have you moved where you were living?: 0    At any time in the past 12 months, were you homeless or living in a shelter (including now)?: No  Utilities: Not At Risk (04/29/2024)   Received from [facility]    In the past 12 months has the electric, gas, oil, or water company threatened to shut off services in your home?: No  Health Literacy: Low Risk (02/07/2023)   Received from [facility] Literacy    How often do you need to have someone help you when you read instructions, pamphlets, or other written material from your doctor or pharmacy?: Never    Review of Systems: See HPI, otherwise negative ROS  Physical Exam: Vital signs in last 24 hours: Temp:  [97.7 F (36.5 C)] 97.7 F (36.5 C) (01/16 0906) Resp:  [16] 16 (01/16 0906) BP: (150)/(74) 150/74 (01/16 0906) SpO2:  [100 %] 100 % (01/16 0906) Weight:  [60.2 kg] 60.2 kg (01/16 0845)   General:   Alert,  Well-developed, well-nourished, pleasant and cooperative in NAD Head:  Normocephalic and atraumatic. Eyes:  Sclera clear, no icterus.   Conjunctiva pink. Ears:  Normal auditory acuity. Nose:  No deformity, discharge,  or lesions. Msk:  Symmetrical without gross deformities. Normal posture. Extremities:  Without clubbing or edema. Neurologic:  Alert and  oriented x4;  grossly normal neurologically. Skin:  Intact without significant lesions or rashes. Psych:  Alert and cooperative. Normal mood and  affect.  Impression/Plan: Amanda Vang is here for a colonoscopy to be performed for colon cancer screening purposes, positive Cologuard testing.  The risks of the procedure including infection, bleed, or perforation as well as benefits, limitations, alternatives and imponderables have been reviewed with the patient. Questions have been answered. All parties agreeable.  "

## 2024-08-24 NOTE — Discharge Instructions (Addendum)
" °  Colonoscopy Discharge Instructions  Read the instructions outlined below and refer to this sheet in the next few weeks. These discharge instructions provide you with general information on caring for yourself after you leave the hospital. Your doctor may also give you specific instructions. While your treatment has been planned according to the most current medical practices available, unavoidable complications occasionally occur.   ACTIVITY You may resume your regular activity, but move at a slower pace for the next 24 hours.  Take frequent rest periods for the next 24 hours.  Walking will help get rid of the air and reduce the bloated feeling in your belly (abdomen).  No driving for 24 hours (because of the medicine (anesthesia) used during the test).   Do not sign any important legal documents or operate any machinery for 24 hours (because of the anesthesia used during the test).  NUTRITION Drink plenty of fluids.  You may resume your normal diet as instructed by your doctor.  Begin with a light meal and progress to your normal diet. Heavy or fried foods are harder to digest and may make you feel sick to your stomach (nauseated).  Avoid alcoholic beverages for 24 hours or as instructed.  MEDICATIONS You may resume your normal medications unless your doctor tells you otherwise.  WHAT YOU CAN EXPECT TODAY Some feelings of bloating in the abdomen.  Passage of more gas than usual.  Spotting of blood in your stool or on the toilet paper.  IF YOU HAD POLYPS REMOVED DURING THE COLONOSCOPY: No aspirin  products for 7 days or as instructed.  No alcohol for 7 days or as instructed.  Eat a soft diet for the next 24 hours.  FINDING OUT THE RESULTS OF YOUR TEST Not all test results are available during your visit. If your test results are not back during the visit, make an appointment with your caregiver to find out the results. Do not assume everything is normal if you have not heard from your  caregiver or the medical facility. It is important for you to follow up on all of your test results.  SEEK IMMEDIATE MEDICAL ATTENTION IF: You have more than a spotting of blood in your stool.  Your belly is swollen (abdominal distention).  You are nauseated or vomiting.  You have a temperature over 101.  You have abdominal pain or discomfort that is severe or gets worse throughout the day.   Your colonoscopy revealed a very large polyp.  I removed this successfully today.  To close defect after polypectomy, I did place 3 metallic clips to reduce risk of post polypectomy bleeding.  These will naturally fall off over the next few months.  You may or may not see them in your stool.  Await pathology results, my office will contact you.  Will need to repeat colonoscopy in 6 months.  Follow-up with me in the office in 3 months.  I hope you have a great rest of your week!  Carlin POUR. Cindie, D.O. Gastroenterology and Hepatology [facility]  "

## 2024-08-24 NOTE — Transfer of Care (Signed)
 Immediate Anesthesia Transfer of Care Note  Patient: Amanda Vang  Procedure(s) Performed: COLONOSCOPY POLYPECTOMY, INTESTINE CONTROL OF HEMORRHAGE, GI TRACT, ENDOSCOPIC, BY CLIPPING OR OVERSEWING  Patient Location: Short Stay  Anesthesia Type:General  Level of Consciousness: drowsy  Airway & Oxygen Therapy: Patient Spontanous Breathing  Post-op Assessment: Report given to RN and Post -op Vital signs reviewed and stable  Post vital signs: Reviewed and stable  Last Vitals:  Vitals Value Taken Time  BP 107/57 08/24/24 10:02  Temp 36.6 C 08/24/24 10:02  Pulse 73 08/24/24 10:02  Resp 22 08/24/24 10:02  SpO2 100 % 08/24/24 10:02    Last Pain:  Vitals:   08/24/24 1002  TempSrc: Oral  PainSc: 0-No pain         Complications: No notable events documented.

## 2024-08-24 NOTE — Anesthesia Postprocedure Evaluation (Signed)
"   Anesthesia Post Note  Patient: Amanda Vang  Procedure(s) Performed: COLONOSCOPY POLYPECTOMY, INTESTINE CONTROL OF HEMORRHAGE, GI TRACT, ENDOSCOPIC, BY CLIPPING OR OVERSEWING  Patient location during evaluation: Phase II Anesthesia Type: MAC Level of consciousness: awake Pain management: pain level controlled Vital Signs Assessment: post-procedure vital signs reviewed and stable Respiratory status: spontaneous breathing and respiratory function stable Cardiovascular status: blood pressure returned to baseline and stable Postop Assessment: no headache and no apparent nausea or vomiting Anesthetic complications: no Comments: Late entry   No notable events documented.   Last Vitals:  Vitals:   08/24/24 0906 08/24/24 1002  BP: (!) 150/74 (!) 107/57  Pulse:  73  Resp: 16 (!) 22  Temp: 36.5 C 36.6 C  SpO2: 100% 100%    Last Pain:  Vitals:   08/24/24 1002  TempSrc: Oral  PainSc: 0-No pain                 Yvonna PARAS Griffey Nicasio      "

## 2024-08-27 LAB — SURGICAL PATHOLOGY

## 2024-08-28 ENCOUNTER — Encounter (HOSPITAL_COMMUNITY): Payer: Self-pay | Admitting: Internal Medicine

## 2024-08-31 ENCOUNTER — Ambulatory Visit: Payer: Self-pay | Admitting: Internal Medicine

## 2024-09-12 NOTE — Progress Notes (Signed)
6 mth TCS noted in recall

## 2024-12-07 ENCOUNTER — Ambulatory Visit: Admitting: Cardiovascular Disease
